# Patient Record
Sex: Male | Born: 1940 | Race: White | Hispanic: No | Marital: Married | Smoking: Never smoker
Health system: Southern US, Community
[De-identification: ages and names within clinical notes are randomized; demographics above are authoritative.]

## PROBLEM LIST (undated history)

## (undated) DIAGNOSIS — I4510 Unspecified right bundle-branch block: Secondary | ICD-10-CM

## (undated) DIAGNOSIS — E781 Pure hyperglyceridemia: Secondary | ICD-10-CM

## (undated) HISTORY — DX: Pure hyperglyceridemia: E78.1

## (undated) HISTORY — DX: Hemochromatosis, unspecified: E83.119

## (undated) HISTORY — DX: Unspecified right bundle-branch block: I45.10

## (undated) HISTORY — PX: BACK SURGERY: SHX140

---

## 2003-07-21 ENCOUNTER — Encounter: Admission: RE | Admit: 2003-07-21 | Discharge: 2003-08-06 | Payer: Self-pay | Admitting: Occupational Medicine

## 2004-05-31 ENCOUNTER — Encounter: Admission: RE | Admit: 2004-05-31 | Discharge: 2004-05-31 | Payer: Self-pay | Admitting: Specialist

## 2004-07-28 ENCOUNTER — Observation Stay (HOSPITAL_COMMUNITY): Admission: RE | Admit: 2004-07-28 | Discharge: 2004-07-29 | Payer: Self-pay | Admitting: Specialist

## 2007-08-12 ENCOUNTER — Encounter: Admission: RE | Admit: 2007-08-12 | Discharge: 2007-08-12 | Payer: Self-pay | Admitting: Family Medicine

## 2010-07-29 NOTE — Op Note (Signed)
NAME:  Mark Booker, Mark Booker NO.:  [id number]   MEDICAL RECORD NO.:  [id number]          PATIENT TYPE:  AMB   LOCATION:  DAY                          FACILITY:  [facility]   PHYSICIAN:  Jene Every, M.D.    DATE OF BIRTH:  March 27, 1940   DATE OF PROCEDURE:  07/28/2004  DATE OF DISCHARGE:                                 OPERATIVE REPORT   PREOPERATIVE DIAGNOSIS:  Spinal stenosis, L4-L5, left.   POSTOPERATIVE DIAGNOSIS:  Spinal stenosis, L4-L5, left.   PROCEDURE PERFORMED:  Lateral recess decompression with microdiskectomy, L4-  L5, foraminotomy L4 and L5.   ANESTHESIA:  General.   SURGEON:  Jene Every, M.D.   ASSISTANT:  Roma Schanz, P.A.   BRIEF HISTORY AND INDICATIONS:  This is a 70 year old with refractory left  lower extremity radicular pain in the L5 nerve root distribution, positive  neural tension signs, EHL weakness due to lateral recess stenosis and neural  compressive injury.  Operative intervention was indicated for decompression  of the L5 nerve root. Risks and benefits were discussed including bleeding,  infection, _damage to neurovascular structures_________, CSF leakage,  epidural fibrosis, adjacent segment disease, need for fusion in the future,  etc.   TECHNIQUE:  The patient was placed in the supine position and after the  induction of adequate general anesthesia, 1 g of Kefzol was given, and he  was placed prone on the Edwards frame.  All bony prominences were well  padded.  The lumbar region was prepped and draped in the usual sterile  fashion.  Two 18-gauge spinal needles were utilized and localize the L4-L5  space, confirmed with x-ray.  Incision was made from the spinous processes  of L4 to L5.  Subcutaneous tissue was dissected. Electrocautery was utilized  to achieve hemostasis.  Dorsolumbar fascia was identified and divided in  line with the skin incision.  Paraspinous muscles were elevated from the  laminae of L4 and L5.  The  McCullough retractor was placed.  The operating  microscope was draped and brought into the surgical field after a Penfield 4  was placed in the interlaminar space, and an x-ray was obtained to confirmed  the L4-L5 space.  A hemilaminectomy at the caudal edge of 4 performed with  the Leksell, a 2 and a 3-mm Kerrison detaching the cephalad edge of the  ligamentum.  The ligamentum was detached from the cephalad edge of L4 with a  straight curette.  Laminotomy of L5 was performed with 2 and a 3-mm  Kerrison. Foraminotomy of L5 was performed.  Significant hypertrophic  ligamentum flavum and facet arthropathy was noted compressing the L5 root  and the lateral recess.  The L5 root was gently mobilized medially, and the  lateral recess decompressed with 2 and 3-mm Kerrison to the medial border of  the pedicle.  Bipolar cautery was utilized to achieve hemostasis.  Exam of  the disk was not herniated.  Hockey stick probe was placed in the foramen at  L4 and L5 and found it widely patent.  There was no disk material underneath  the thecal sac, or in the  axilla of the root, or in either foramen.  Next,  the wound was copiously irrigated with antibiotic irrigation.  Bone wax was  placed on the cancellous surfaces.  There was no evidence of CSF leak or  direct bleeding.  Thrombin-soaked Gelfoam was placed in the laminotomy  defect.  The McCullough retractor was removed.  Paraspinous muscle was  inspected with no evidence of active bleeding.  Dorsolumbar fascia was re-  approximated with #1 Vicryl interrupted figure-of-eight sutures.  The  subcutaneous tissue was re-approximated with 2-0 Vicryl simple sutures, and  the skin was re-approximated with 4-0 subcuticular Prolene.  The wound was  reinforced with Steri-Strips.  Sterile dressing was applied.  The patient  was placed supine on the hospital bed, extubated without difficulty, and  transported to recovery in satisfactory condition.   The patient  tolerated the procedure well with no complications.   Blood loss was 30 cc.      JB/MEDQ  D:  07/28/2004  T:  07/28/2004  Job:  [id number]

## 2011-09-29 ENCOUNTER — Ambulatory Visit
Admission: RE | Admit: 2011-09-29 | Discharge: 2011-09-29 | Disposition: A | Discharge status: Home / Self Care | Payer: Medicare Other | Source: Ambulatory Visit | Attending: Family Medicine | Admitting: Family Medicine

## 2011-09-29 ENCOUNTER — Other Ambulatory Visit: Payer: Self-pay | Admitting: Family Medicine

## 2011-09-29 DIAGNOSIS — M545 Low back pain: Secondary | ICD-10-CM

## 2011-10-19 ENCOUNTER — Telehealth: Payer: Self-pay | Admitting: Oncology

## 2011-10-19 NOTE — Telephone Encounter (Signed)
LMONVM FOR PT RE CALLING ME FOR APPT W/FS.

## 2011-10-19 NOTE — Telephone Encounter (Signed)
Pt returned call and was given appt for 8/12 w/FS.

## 2011-10-20 ENCOUNTER — Other Ambulatory Visit: Payer: Self-pay | Admitting: Oncology

## 2011-10-23 ENCOUNTER — Other Ambulatory Visit: Payer: Self-pay | Admitting: Oncology

## 2011-10-23 ENCOUNTER — Telehealth: Payer: Self-pay | Admitting: Oncology

## 2011-10-23 NOTE — Telephone Encounter (Signed)
Referred by Dr. Blair Heys.

## 2011-10-24 ENCOUNTER — Ambulatory Visit (HOSPITAL_BASED_OUTPATIENT_CLINIC_OR_DEPARTMENT_OTHER): Payer: Medicare Other | Admitting: Oncology

## 2011-10-24 ENCOUNTER — Other Ambulatory Visit (HOSPITAL_BASED_OUTPATIENT_CLINIC_OR_DEPARTMENT_OTHER): Payer: Medicare Other | Admitting: Lab

## 2011-10-24 ENCOUNTER — Encounter: Payer: Self-pay | Admitting: Oncology

## 2011-10-24 ENCOUNTER — Ambulatory Visit: Payer: Medicare Other

## 2011-10-24 LAB — FERRITIN: Ferritin: 52 ng/mL (ref 22–322)

## 2011-10-24 LAB — CBC WITH DIFFERENTIAL/PLATELET
BASO%: 0.6 % (ref 0.0–2.0)
Basophils Absolute: 0 10*3/uL (ref 0.0–0.1)
EOS%: 2.2 % (ref 0.0–7.0)
HCT: 44.2 % (ref 38.4–49.9)
HGB: 15.5 g/dL (ref 13.0–17.1)
LYMPH%: 33.1 % (ref 14.0–49.0)
MCH: 34.4 pg — ABNORMAL HIGH (ref 27.2–33.4)
MCHC: 35.1 g/dL (ref 32.0–36.0)
MCV: 97.9 fL (ref 79.3–98.0)
MONO%: 9.5 % (ref 0.0–14.0)
NEUT%: 54.6 % (ref 39.0–75.0)
Platelets: 195 10*3/uL (ref 140–400)

## 2011-10-24 LAB — IRON AND TIBC
%SAT: 60 % — ABNORMAL HIGH (ref 20–55)
TIBC: 239 ug/dL (ref 215–435)

## 2011-10-24 LAB — COMPREHENSIVE METABOLIC PANEL
ALT: 14 U/L (ref 0–53)
AST: 20 U/L (ref 0–37)
Alkaline Phosphatase: 72 U/L (ref 39–117)
BUN: 15 mg/dL (ref 6–23)
Calcium: 9.3 mg/dL (ref 8.4–10.5)
Chloride: 103 mEq/L (ref 96–112)
Creatinine, Ser: 1.01 mg/dL (ref 0.50–1.35)
Total Bilirubin: 0.6 mg/dL (ref 0.3–1.2)

## 2011-10-24 NOTE — Progress Notes (Signed)
Patient came in today as a new patient with his wife and he has one insurance,he said that he should be [place] as far as financial assistance.

## 2011-10-24 NOTE — Progress Notes (Signed)
Note dictated

## 2011-10-24 NOTE — Progress Notes (Signed)
CC:   Mark Booker. Mark Booker, M.D.  REASON FOR CONSULTATION:  History of hemochromatosis, elevated MCV.  HISTORY OF PRESENT ILLNESS:  Mark Booker is a pleasant 71 year old gentleman currently of Blue.  He is a relatively healthy 71 year old man without really any significant comorbid conditions.  He has a history of GERD as well as glaucoma but for the most part has been in general excellent health.  About 15 years ago he was told that he has hemochromatosis based on a blood testing.  He reports that he had a liver biopsy as well and was instructed to donate blood occasionally. He has been donating blood to the [facility] about once every 6 months. Last time he had done it was about 3 months ago, and he has been followed routinely by Dr. Manus Booker for his routine health maintenance. In his most recent blood work back on October 13, 2011, his hemoglobin was 17 g, the upper limit of normal was 17.  His white count was 7.4.  His MCV was 100.1, upper limit of normal was 94.  His platelet count was 213.  He had normal chemistries.  He has normal liver function tests. His iron level at that time was 54, which was in normal range.  It was 40 as depicted back in June of 2012.  For that reason, the patient was referred to me for evaluation regarding his potential hemochromatosis. Clinically, Mark Booker is asymptomatic.  He does not report any abdominal pain.  Does not report any shoulder pain.  He had not had any skin changes.  He had not had any problem with diabetes or blood sugar intolerance.  Performance status and activity level are really unchanged.  REVIEW OF SYSTEMS:  He does not report any headaches, blurry vision, double vision.  He did not report any motor or sensory neuropathy.  He does not report any alteration in mental status.  He does not report any psychiatric issues, depression.  Did not report any fever, chills, sweats.  Does not report any cough, hemoptysis, hematemesis.  No  nausea or vomiting.  Did not report any abdominal pain.  No hematochezia.  No melena.  No genitourinary complaints.  Rest of review of systems is unremarkable.  PAST MEDICAL HISTORY:  Significant for GERD, history of glaucoma, psoriasis, back pain, status post a hemorrhoid surgery.  MEDICATION:  He is on Prilosec, glucosamine, vitamin D supplements, and Xalatan ophthalmic solution.  FAMILY HISTORY:  His father had prostate cancer in his 30s.  Mother had rheumatoid arthritis.  He has 1 brother with prostate cancer.  I think he has a cousin who has hemochromatosis.  SOCIAL HISTORY:  He is married.  He does not have any children.  He denied any alcohol or tobacco abuse.  ALLERGIES:  None.  PHYSICAL EXAMINATION:  Alert, awake gentleman, who appeared in no active distress today.  His blood pressure is 114/76, pulse 82, respiration 20, temperature is 97.  HEENT:  Head is normocephalic, atraumatic.  Pupils equal and round, reactive to light.  Oral mucosa moist and pink.  Neck: Supple without adenopathy.  Heart:  Regular rate and rhythm.  S1 and S2. Lungs:  Clear to auscultation.  Abdomen:  Soft, nontender.  No splenomegaly.  The edge of the liver is barely palpable on examination. No rebound or guarding.  Extremities:  No edema.  LABORATORY DATA:  His hemoglobin is 15.5, white cell count is 6.6, platelet count of 195, all within normal range.  His MCV is  actually normal at 79.9.  ASSESSMENT AND PLAN:  A 71 year old gentleman the following issues: 1. 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  As it stands right now, I     really see no evidence of increase in his iron metabolism at this     point.  His ferritin level is within normal range.  His hemoglobin     is within perfectly normal range.  His liver function test is all     within normal range.  There is no physical exam finding 2 suggest     iron deposition disease.  At this time, I really do not see any     intervention needed from  a hematology standpoint.  He inquired     about getting a phlebotomy about once every 6 months.  I told him I     think that would be fine.  Donating blood about once every 6-12     months will be reasonable. at this time. 2. MCV elevation.  Again, his MCV is perfectly normal at this time.     Again, this could be related to a very mild form of hemochromatosis     that right now is no longer an issue.  RECOMMENDATION:  For moving forward, I do recommend that he gets a CBC and iron checked about once a year, and I did not schedule Mark Booker any followup here but I will be more than happy to see him anytime felt needed.    ______________________________ Benjiman Core, M.D. FNS/MEDQ  D:  10/24/2011  T:  10/24/2011  Job:  [id number]

## 2013-09-04 IMAGING — CR DG LUMBAR SPINE COMPLETE 4+V
5 series · 5 of 5 positions shown · non-contrast
Comparison: Lumbar spine films of 01/27/2011

CLINICAL DATA: Left-sided low back pain, no injury

LUMBAR SPINE - COMPLETE 4+ VIEW

[t l-spine a.p.]
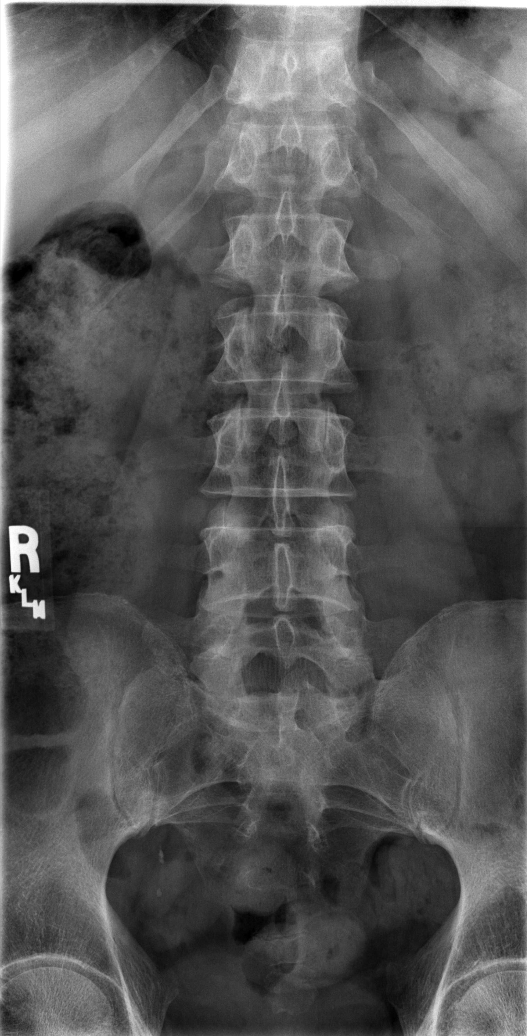

[t l-spine oblique exposure (1 of 2)]
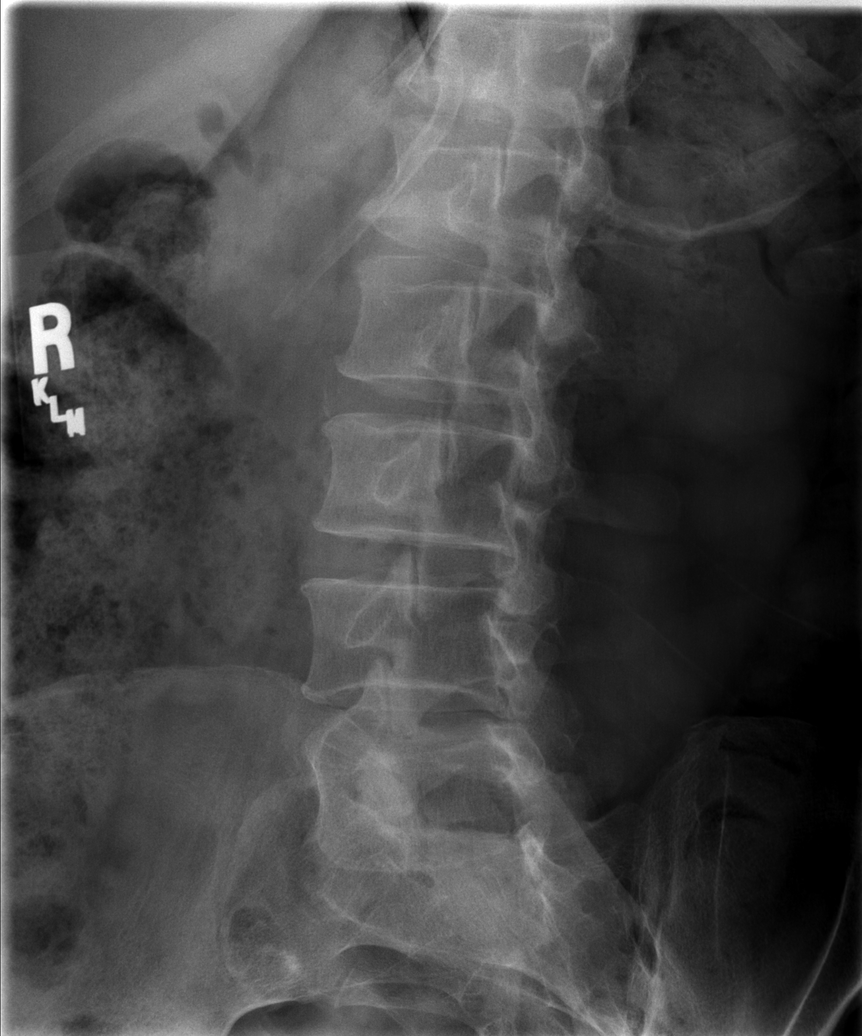

[t l-spine oblique exposure (2 of 2)]
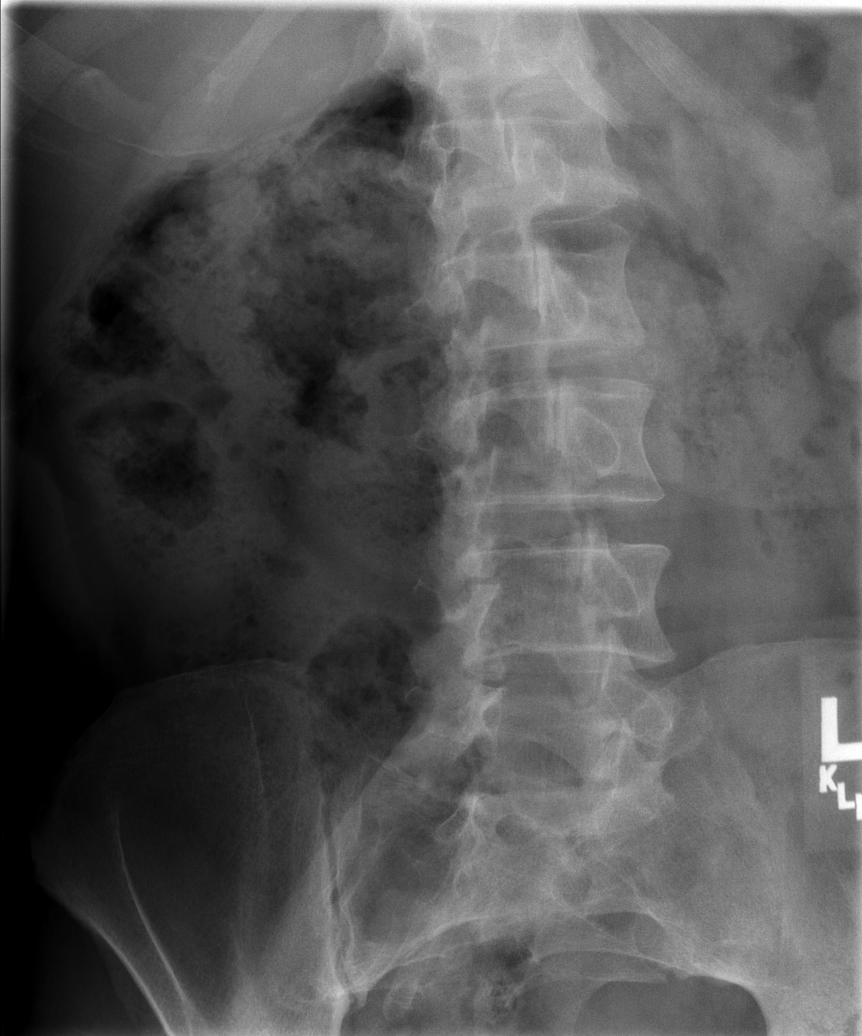

[t l-spine lat]
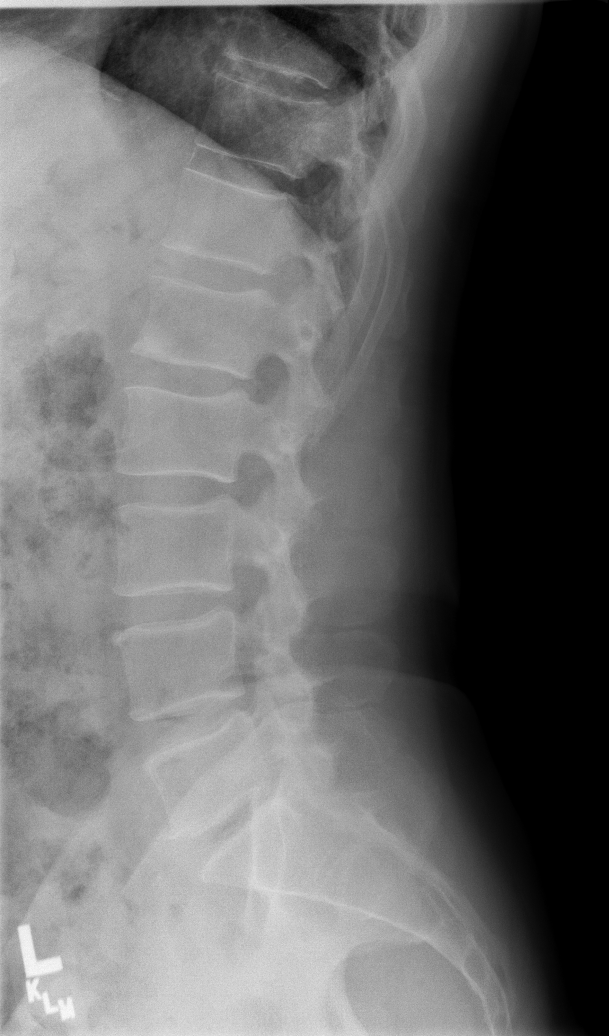

[t l-spine l5-s1 spot]
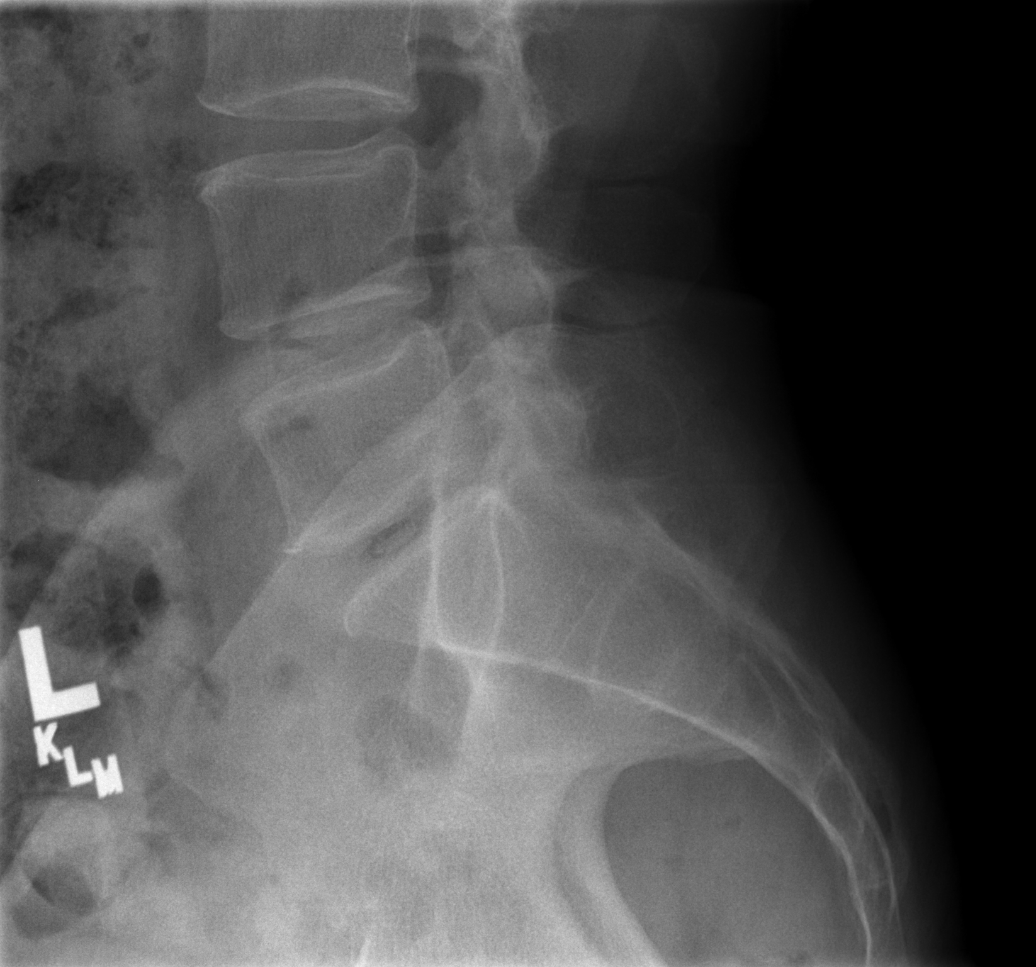

[5 of 5 positions shown; findings below may reference images not displayed]

FINDINGS: The lumbar vertebrae remain in normal alignment.  There
is degenerative disc disease which appears relatively stable at the
L5 S1 level where there is some loss of disc space and mild vacuum
disc phenomenon.  There may be mild vacuum disc phenomenon at L4-5
but the intervertebral disc space is maintained.  The remainder of
the intervertebral disc spaces appear normal.  No compression
deformity is seen.  The SI joints appear corticated.
IMPRESSION: Relatively stable degenerative disc disease at L5 S1.  Normal
alignment.

## 2016-01-25 ENCOUNTER — Telehealth: Payer: Self-pay | Admitting: Oncology

## 2016-01-25 NOTE — Telephone Encounter (Signed)
lvm to inform pt of 12/7 appt date/time per LOS °

## 2016-02-16 ENCOUNTER — Other Ambulatory Visit: Payer: Self-pay | Admitting: Oncology

## 2016-02-17 ENCOUNTER — Ambulatory Visit (HOSPITAL_BASED_OUTPATIENT_CLINIC_OR_DEPARTMENT_OTHER): Payer: Medicare Other | Admitting: Oncology

## 2016-02-17 ENCOUNTER — Other Ambulatory Visit (HOSPITAL_BASED_OUTPATIENT_CLINIC_OR_DEPARTMENT_OTHER): Payer: Medicare Other

## 2016-02-17 ENCOUNTER — Telehealth: Payer: Self-pay | Admitting: Oncology

## 2016-02-17 LAB — COMPREHENSIVE METABOLIC PANEL
ALT: 31 U/L (ref 0–55)
ANION GAP: 10 meq/L (ref 3–11)
AST: 30 U/L (ref 5–34)
Albumin: 3.8 g/dL (ref 3.5–5.0)
Alkaline Phosphatase: 97 U/L (ref 40–150)
BUN: 16 mg/dL (ref 7.0–26.0)
CHLORIDE: 108 meq/L (ref 98–109)
CO2: 25 meq/L (ref 22–29)
Calcium: 9.7 mg/dL (ref 8.4–10.4)
Creatinine: 1.1 mg/dL (ref 0.7–1.3)
EGFR: 65 mL/min/{1.73_m2} — AB (ref >90)
Glucose: 107 mg/dl (ref 70–140)
Potassium: 5.2 mEq/L — ABNORMAL HIGH (ref 3.5–5.1)
Sodium: 142 mEq/L (ref 136–145)
Total Bilirubin: 0.56 mg/dL (ref 0.20–1.20)
Total Protein: 7.5 g/dL (ref 6.4–8.3)

## 2016-02-17 LAB — CBC WITH DIFFERENTIAL/PLATELET
BASO%: 0.3 % (ref 0.0–2.0)
Basophils Absolute: 0 10*3/uL (ref 0.0–0.1)
EOS ABS: 0.1 10*3/uL (ref 0.0–0.5)
EOS%: 1.9 % (ref 0.0–7.0)
HCT: 45.5 % (ref 38.4–49.9)
HGB: 15.8 g/dL (ref 13.0–17.1)
LYMPH%: 36.4 % (ref 14.0–49.0)
MCH: 34.1 pg — ABNORMAL HIGH (ref 27.2–33.4)
MCHC: 34.7 g/dL (ref 32.0–36.0)
MCV: 98.3 fL — AB (ref 79.3–98.0)
MONO#: 0.6 10*3/uL (ref 0.1–0.9)
MONO%: 9.5 % (ref 0.0–14.0)
NEUT#: 3.3 10*3/uL (ref 1.5–6.5)
NEUT%: 51.9 % (ref 39.0–75.0)
PLATELETS: 217 10*3/uL (ref 140–400)
RBC: 4.63 10*6/uL (ref 4.20–5.82)
RDW: 12.8 % (ref 11.0–14.6)
WBC: 6.3 10*3/uL (ref 4.0–10.3)
lymph#: 2.3 10*3/uL (ref 0.9–3.3)

## 2016-02-17 NOTE — Progress Notes (Signed)
Hematology and Oncology Follow Up Visit  Mark Booker [id number] 05/09/1940 75 y.o. 02/17/2016 3:48 PM No primary care provider on file.No ref. provider found   Principle Diagnosis: 75 year old gentleman with history of hemachromatosis with intermittent phlebotomies that has been done in the past.  Current therapy: Observation and surveillance.  Interim History: Mr. Mark Booker this today for a follow-up visit. He is a pleasant gentleman I saw in consultation the past for presumed hemachromatosis. At that time his ferritin level was within normal range. His last evaluation was in 2013. He has been followed by his primary care provider regarding this issue. He recently had a ferritin level checked and was elevated at 300 and I was asked to comment about these findings. Clinically he is completely asymptomatic at this time. He does not report any major changes in his performance status, or activity level. He declines any joint pain, discoloration of the skin or change in his mobility. He continues to attend his activities of daily living.  He does not report any headaches, blurry vision, syncope or seizures. He does not report any fevers or chills or sweats. He does not report any cough, wheezing or hemoptysis. He does not report any nausea, vomiting or abdominal pain. He does not report any frequency urgency or hesitancy. He does not report any skeletal complaints. Remaining review of systems unremarkable.  Medications: I have reviewed the patient's current medications.  Current Outpatient Prescriptions  Medication Sig Dispense Refill  . fish oil-omega-3 fatty acids 1000 MG capsule Take 1 g by mouth daily.    Marland Kitchen. glucosamine-chondroitin 500-400 MG tablet Take 1 tablet by mouth daily.    Marland Kitchen. omeprazole (PRILOSEC) 20 MG capsule Take 20 mg by mouth daily.     No current facility-administered medications for this visit.      Allergies: Not on File  Past Medical History, Surgical history, Social  history, and Family History were reviewed and updated.   Physical Exam: Blood pressure 127/68, pulse 61, temperature 97.6 F (36.4 C), temperature source Oral, resp. rate 16, height 6\' 1"  (1.854 m), weight 194 lb 3.2 oz (88.1 kg), SpO2 98 %. ECOG: 0 General appearance: alert and cooperative Head: Normocephalic, without obvious abnormality Neck: no adenopathy Lymph nodes: Cervical, supraclavicular, and axillary nodes normal. Heart:regular rate and rhythm, S1, S2 normal, no murmur, click, rub or gallop Lung:chest clear, no wheezing, rales, normal symmetric air entry Abdomin: soft, non-tender, without masses or organomegaly EXT:no erythema, induration, or nodules   Lab Results: Lab Results  Component Value Date   WBC 6.3 02/17/2016   HGB 15.8 02/17/2016   HCT 45.5 02/17/2016   MCV 98.3 (H) 02/17/2016   PLT 217 02/17/2016     Chemistry      Component Value Date/Time   NA 142 02/17/2016 1503   K 5.2 (H) 02/17/2016 1503   CL 103 10/24/2011 1035   CO2 25 02/17/2016 1503   BUN 16.0 02/17/2016 1503   CREATININE 1.1 02/17/2016 1503      Component Value Date/Time   CALCIUM 9.7 02/17/2016 1503   ALKPHOS 97 02/17/2016 1503   AST 30 02/17/2016 1503   ALT 31 02/17/2016 1503   BILITOT 0.56 02/17/2016 1503          Impression and Plan:  75 year old gentleman with the following issues:  1. Hemochromatosis: This has been a presumed diagnosis 4/20 years. The natural course of this disease was reviewed today he appears to have a mild phenotype with very little to no manifestation of  this disease. I recommend different strategies to manage this moving forward including therapeutic phlebotomy and possibly blood donation. The plan is to recheck his ferritin level and institute therapeutic phlebotomy if his ferritin level continues to be elevated above 500.  2. Liver function surveillance: His liver function test has been within normal range and he is completely asymptomatic.  3.  Follow-up: Will be in 6 months and repeat his iron levels.   [name],[name], MD 12/7/20173:48 PM

## 2016-02-17 NOTE — Telephone Encounter (Signed)
Appointments scheduled per 12/7 LOS. Patient given AVS report and calendars with future scheduled appointments.  °

## 2016-02-18 LAB — IRON AND TIBC
%SAT: 80 % — AB (ref 20–55)
Iron: 186 ug/dL — ABNORMAL HIGH (ref 42–163)
TIBC: 234 ug/dL (ref 202–409)
UIBC: 48 ug/dL — ABNORMAL LOW (ref 117–376)

## 2016-02-18 LAB — FERRITIN: FERRITIN: 234 ng/mL (ref 22–316)

## 2016-08-17 ENCOUNTER — Ambulatory Visit: Payer: Medicare Other | Admitting: Oncology

## 2016-08-17 ENCOUNTER — Other Ambulatory Visit (HOSPITAL_BASED_OUTPATIENT_CLINIC_OR_DEPARTMENT_OTHER): Payer: Medicare Other

## 2016-08-17 LAB — CBC WITH DIFFERENTIAL/PLATELET
BASO%: 0.7 % (ref 0.0–2.0)
BASOS ABS: 0 10*3/uL (ref 0.0–0.1)
EOS%: 1.7 % (ref 0.0–7.0)
Eosinophils Absolute: 0.1 10*3/uL (ref 0.0–0.5)
HEMATOCRIT: 44.7 % (ref 38.4–49.9)
HGB: 15.7 g/dL (ref 13.0–17.1)
LYMPH#: 2.2 10*3/uL (ref 0.9–3.3)
LYMPH%: 35.3 % (ref 14.0–49.0)
MCH: 34.8 pg — AB (ref 27.2–33.4)
MCHC: 35.2 g/dL (ref 32.0–36.0)
MCV: 99 fL — AB (ref 79.3–98.0)
MONO#: 0.5 10*3/uL (ref 0.1–0.9)
MONO%: 8.8 % (ref 0.0–14.0)
NEUT#: 3.3 10*3/uL (ref 1.5–6.5)
NEUT%: 53.5 % (ref 39.0–75.0)
Platelets: 208 10*3/uL (ref 140–400)
RBC: 4.51 10*6/uL (ref 4.20–5.82)
RDW: 12.8 % (ref 11.0–14.6)
WBC: 6.2 10*3/uL (ref 4.0–10.3)

## 2016-08-17 LAB — FERRITIN: FERRITIN: 64 ng/mL (ref 22–316)

## 2016-08-17 LAB — COMPREHENSIVE METABOLIC PANEL
ALT: 33 U/L (ref 0–55)
ANION GAP: 8 meq/L (ref 3–11)
AST: 36 U/L — AB (ref 5–34)
Albumin: 4 g/dL (ref 3.5–5.0)
Alkaline Phosphatase: 87 U/L (ref 40–150)
BUN: 10.9 mg/dL (ref 7.0–26.0)
CALCIUM: 9.6 mg/dL (ref 8.4–10.4)
CHLORIDE: 106 meq/L (ref 98–109)
CO2: 24 mEq/L (ref 22–29)
Creatinine: 1.2 mg/dL (ref 0.7–1.3)
EGFR: 61 mL/min/{1.73_m2} — AB (ref >90)
Glucose: 102 mg/dl (ref 70–140)
POTASSIUM: 4.4 meq/L (ref 3.5–5.1)
Sodium: 139 mEq/L (ref 136–145)
Total Bilirubin: 0.83 mg/dL (ref 0.20–1.20)
Total Protein: 7.2 g/dL (ref 6.4–8.3)

## 2016-08-17 LAB — IRON AND TIBC
%SAT: 46 % (ref 20–55)
IRON: 131 ug/dL (ref 42–163)
TIBC: 284 ug/dL (ref 202–409)
UIBC: 153 ug/dL (ref 117–376)

## 2016-08-24 ENCOUNTER — Telehealth: Payer: Self-pay | Admitting: Oncology

## 2016-08-24 ENCOUNTER — Ambulatory Visit (HOSPITAL_BASED_OUTPATIENT_CLINIC_OR_DEPARTMENT_OTHER): Payer: Medicare Other | Admitting: Oncology

## 2016-08-24 NOTE — Telephone Encounter (Signed)
Scheduled appt per 6/14 los -Gave patient AVS and calender per LOS- lab and f/u in 6 month.

## 2016-08-24 NOTE — Progress Notes (Signed)
Hematology and Oncology Follow Up Visit  Mark Booker [id number] 05/02/1940 76 y.o. 08/24/2016 9:24 AM Mark Booker, Mark Booker, MDNo ref. provider found   Principle Diagnosis: 76 year old gentleman with history of hemachromatosis with intermittent phlebotomies that has been done in the past. This was diagnosed in 2013. This was diagnosed by liver biopsy that is not available to me at this time.  Current therapy: Intermittent phlebotomies he [facility].  Interim History: Mark Booker this today for a follow-up visit. Since the last visit, he has been donating blood intermittently close to 2 months. He tolerated blood donation without complications. He denied any nausea or dizziness. He denied any recent falls or decline in his performance status. He denied any skin discoloration or any hospitalizations. He continues to perform activities of daily living without decline.  He does not report any headaches, blurry vision, syncope or seizures. He does not report any fevers or chills or sweats. He does not report any cough, wheezing or hemoptysis. He does not report any nausea, vomiting or abdominal pain. He does not report any frequency urgency or hesitancy. He does not report any skeletal complaints. Remaining review of systems unremarkable.  Medications: I have reviewed the patient's current medications.  Current Outpatient Prescriptions  Medication Sig Dispense Refill  . fish oil-omega-3 fatty acids 1000 MG capsule Take 1 g by mouth daily.    Marland Kitchen. glucosamine-chondroitin 500-400 MG tablet Take 1 tablet by mouth daily.    Marland Kitchen. omeprazole (PRILOSEC) 20 MG capsule Take 20 mg by mouth daily.     No current facility-administered medications for this visit.      Allergies: Not on File  Past Medical History, Surgical history, Social history, and Family History were reviewed and updated.   Physical Exam: Blood pressure 122/70, pulse (!) 57, temperature 98 F  (36.7 Booker), temperature source Oral, resp. rate 17, height 6\' 1"  (1.854 m), weight 195 lb 6.4 oz (88.6 kg), SpO2 100 %. ECOG: 0 General appearance: alert and cooperative. Without distress. Head: Normocephalic, without obvious abnormality. No oral ulcers or lesions. Neck: no adenopathy Lymph nodes: Cervical, supraclavicular, and axillary nodes normal. Heart:regular rate and rhythm, S1, S2 normal, no murmur, click, rub or gallop Lung:chest clear, no wheezing, rales, normal symmetric air entry Abdomin: soft, non-tender, without masses or organomegaly no shifting dullness or ascites. EXT:no erythema, induration, or nodules   Lab Results: Lab Results  Component Value Date   WBC 6.2 08/17/2016   HGB 15.7 08/17/2016   HCT 44.7 08/17/2016   MCV 99.0 (H) 08/17/2016   PLT 208 08/17/2016     Chemistry      Component Value Date/Time   NA 139 08/17/2016 0851   K 4.4 08/17/2016 0851   CL 103 10/24/2011 1035   CO2 24 08/17/2016 0851   BUN 10.9 08/17/2016 0851   CREATININE 1.2 08/17/2016 0851      Component Value Date/Time   CALCIUM 9.6 08/17/2016 0851   ALKPHOS 87 08/17/2016 0851   AST 36 (H) 08/17/2016 0851   ALT 33 08/17/2016 0851   BILITOT 0.83 08/17/2016 0851     Results for Mark Booker, Mark Booker (Mark Booker) as of 08/24/2016 09:07  Ref. Range 08/17/2016 08:51  Iron Latest Ref Range: 42 - 163 ug/dL 130131  UIBC Latest Ref Range: 117 - 376 ug/dL 865153  TIBC Latest Ref Range: 202 - 409 ug/dL 784284  %SAT Latest Ref Range: 20 - 55 % 46  Ferritin Latest Ref Range:  22 - 316 ng/ml 64    Impression and Plan:  76 year old gentleman with the following issues:  1. Hemochromatosis: This has been a presumed diagnosis since the late 90s. He has been donating lung intermittently to prevent accumulation of iron and to prevent organ damage.  Iron studies obtained on 08/17/2016 were personally reviewed and showed excellent response to this approach.  The plan is for him to continue intermittent  phlebotomies at a rate of one to 2 tabs every 6 months to keep his ferritin close to 50. He is completely asymptomatic at this time and I encouraged him to continue with this approach.  2. Liver function surveillance: His liver function test has been within normal range and he is completely asymptomatic.  3. Follow-up: Will be in 6 months and repeat his iron levels.   [facility], MD 6/14/20189:24 AM

## 2017-02-08 ENCOUNTER — Other Ambulatory Visit: Payer: Medicare Other

## 2017-02-08 ENCOUNTER — Telehealth: Payer: Self-pay | Admitting: *Deleted

## 2017-02-08 NOTE — Telephone Encounter (Signed)
"  Calling to confirm appointment information and ask do I need to fast?"  Provided all future appointment information and no fasting needed for labs.  Denies further needs or questions with this call.

## 2017-02-09 ENCOUNTER — Other Ambulatory Visit (HOSPITAL_BASED_OUTPATIENT_CLINIC_OR_DEPARTMENT_OTHER): Payer: Medicare Other

## 2017-02-09 LAB — CBC WITH DIFFERENTIAL/PLATELET
BASO%: 0.5 % (ref 0.0–2.0)
Basophils Absolute: 0 10*3/uL (ref 0.0–0.1)
EOS ABS: 0.1 10*3/uL (ref 0.0–0.5)
EOS%: 1.6 % (ref 0.0–7.0)
HCT: 45.2 % (ref 38.4–49.9)
HEMOGLOBIN: 15.8 g/dL (ref 13.0–17.1)
LYMPH%: 29.1 % (ref 14.0–49.0)
MCH: 34.1 pg — ABNORMAL HIGH (ref 27.2–33.4)
MCHC: 35 g/dL (ref 32.0–36.0)
MCV: 97.6 fL (ref 79.3–98.0)
MONO#: 0.6 10*3/uL (ref 0.1–0.9)
MONO%: 9 % (ref 0.0–14.0)
NEUT%: 59.8 % (ref 39.0–75.0)
NEUTROS ABS: 3.8 10*3/uL (ref 1.5–6.5)
PLATELETS: 216 10*3/uL (ref 140–400)
RBC: 4.63 10*6/uL (ref 4.20–5.82)
RDW: 12.7 % (ref 11.0–14.6)
WBC: 6.4 10*3/uL (ref 4.0–10.3)
lymph#: 1.9 10*3/uL (ref 0.9–3.3)

## 2017-02-09 LAB — IRON AND TIBC
%SAT: 99 % — ABNORMAL HIGH (ref 20–55)
Iron: 249 ug/dL — ABNORMAL HIGH (ref 42–163)
TIBC: 251 ug/dL (ref 202–409)
UIBC: 2 ug/dL — ABNORMAL LOW (ref 117–376)

## 2017-02-09 LAB — FERRITIN: FERRITIN: 92 ng/mL (ref 22–316)

## 2017-02-14 ENCOUNTER — Other Ambulatory Visit: Payer: Medicare Other

## 2017-02-21 ENCOUNTER — Ambulatory Visit: Payer: Medicare Other | Admitting: Oncology

## 2017-03-09 ENCOUNTER — Telehealth: Payer: Self-pay | Admitting: Oncology

## 2017-03-09 ENCOUNTER — Ambulatory Visit (HOSPITAL_BASED_OUTPATIENT_CLINIC_OR_DEPARTMENT_OTHER): Payer: Medicare Other | Admitting: Oncology

## 2017-03-09 NOTE — Progress Notes (Signed)
Hematology and Oncology Follow Up Visit  Mark FlurryRichard C Booker [id number] 04/28/1940 76 y.o. 03/09/2017 1:41 PM Manus GunningEhinger, Molly Maduroobert, MDEhinger, Molly Maduroobert, MD   Principle Diagnosis: 76 year old gentleman with history of hemachromatosis with intermittent phlebotomies that has been done in the past. This was diagnosed in 2013. This was diagnosed by liver biopsy that is not available to me at this time.  Current therapy: Intermittent phlebotomies he [facility].  Interim History: Mr. Mark HornCoffer this today for a follow-up visit. Since the last visit, he reports no major changes in his health.  He has not donated any blood since last visit 6 months ago.  He denied any issues when he did donate blood in the past.  He denied any nausea or dizziness. He denied any recent falls or decline in his performance status. He denied any skin discoloration or any hospitalizations.  His performance status and quality of life remain excellent.  He does not report any headaches, blurry vision, syncope or seizures. He does not report any fevers or chills or sweats. He does not report any cough, wheezing or hemoptysis. He does not report any nausea, vomiting or abdominal pain. He does not report any frequency urgency or hesitancy. He does not report any skeletal complaints. Remaining review of systems unremarkable.  Medications: I have reviewed the patient's current medications.  Current Outpatient Medications  Medication Sig Dispense Refill  . fish oil-omega-3 fatty acids 1000 MG capsule Take 1 g by mouth daily.    Marland Kitchen. glucosamine-chondroitin 500-400 MG tablet Take 1 tablet by mouth daily.    Marland Kitchen. omeprazole (PRILOSEC) 20 MG capsule Take 20 mg by mouth daily.     No current facility-administered medications for this visit.      Allergies: Not on File  Past Medical History, Surgical history, Social history, and Family History were reviewed and updated.   Physical Exam: Blood pressure  130/76, pulse 78, temperature 97.8 F (36.6 C), temperature source Oral, resp. rate 18, height 6\' 1"  (1.854 m), weight 197 lb 9.6 oz (89.6 kg), SpO2 100 %. ECOG: 0 General appearance: Well-appearing gentleman without distress. Head: Normocephalic, without obvious abnormality. No oral ulcers or thrush.  Neck: no adenopathy or masses. Lymph nodes: Cervical, supraclavicular, and axillary nodes normal. Heart:regular rate and rhythm, S1, S2 normal, no murmur, click, rub or gallop Lung:chest clear, no wheezing, rales, normal symmetric air entry Abdomin: soft, non-tender, without masses or organomegaly no rebound or guarding. EXT:no erythema, induration, or nodules   Lab Results: Lab Results  Component Value Date   WBC 6.4 02/09/2017   HGB 15.8 02/09/2017   HCT 45.2 02/09/2017   MCV 97.6 02/09/2017   PLT 216 02/09/2017     Chemistry      Component Value Date/Time   NA 139 08/17/2016 0851   K 4.4 08/17/2016 0851   CL 103 10/24/2011 1035   CO2 24 08/17/2016 0851   BUN 10.9 08/17/2016 0851   CREATININE 1.2 08/17/2016 0851      Component Value Date/Time   CALCIUM 9.6 08/17/2016 0851   ALKPHOS 87 08/17/2016 0851   AST 36 (H) 08/17/2016 0851   ALT 33 08/17/2016 0851   BILITOT 0.83 08/17/2016 0851     Results for Brozek, Mark Booker C (MRN [id number]) as of 03/09/2017 13:12  Ref. Range 02/09/2017 12:29  Iron Latest Ref Range: 42 - 163 ug/dL 664249 (H)  UIBC Latest Ref Range: 117 - 376 ug/dL 2 (L)  TIBC Latest Ref Range: 202 -  409 ug/dL 161251  %SAT Latest Ref Range: 20 - 55 % 99 (H)  Ferritin Latest Ref Range: 22 - 316 ng/ml 92     Impression and Plan:  76 year old gentleman with the following issues:  1. Hemochromatosis: diagnosis since the late 90s. He has been donating blood intermittently to prevent accumulation of iron and to prevent organ damage.  Iron studies obtained on 02/09/2017 were reviewed today and showed iron studies to be increasing at this time.  The plan is for him  to continue intermittent phlebotomies via blood donation [facility] every 3-6 months.  2. Liver function surveillance: His liver function test has been within normal range and will be repeated periodically.  3. Follow-up: Will be in 6 months and repeat his iron levels.   Eli HoseFiras Telena Peyser, MD 12/28/20181:41 PM

## 2017-03-09 NOTE — Telephone Encounter (Signed)
Gave patient avs and calendar with appts per 12/28 los °

## 2017-09-04 ENCOUNTER — Inpatient Hospital Stay: Payer: Medicare Other | Attending: Oncology

## 2017-09-04 LAB — COMPREHENSIVE METABOLIC PANEL
ALK PHOS: 99 U/L (ref 38–126)
ALT: 19 U/L (ref 0–44)
AST: 23 U/L (ref 15–41)
Albumin: 4.2 g/dL (ref 3.5–5.0)
Anion gap: 9 (ref 5–15)
BILIRUBIN TOTAL: 0.7 mg/dL (ref 0.3–1.2)
BUN: 17 mg/dL (ref 8–23)
CALCIUM: 9.8 mg/dL (ref 8.9–10.3)
CHLORIDE: 104 mmol/L (ref 98–111)
CO2: 24 mmol/L (ref 22–32)
CREATININE: 1.24 mg/dL (ref 0.61–1.24)
GFR calc non Af Amer: 55 mL/min — ABNORMAL LOW (ref >60)
Glucose, Bld: 126 mg/dL — ABNORMAL HIGH (ref 70–99)
Potassium: 3.8 mmol/L (ref 3.5–5.1)
Sodium: 137 mmol/L (ref 135–145)
TOTAL PROTEIN: 7.5 g/dL (ref 6.5–8.1)

## 2017-09-04 LAB — CBC WITH DIFFERENTIAL/PLATELET
BASOS ABS: 0 10*3/uL (ref 0.0–0.1)
Basophils Relative: 1 %
Eosinophils Absolute: 0.1 10*3/uL (ref 0.0–0.5)
Eosinophils Relative: 2 %
HEMATOCRIT: 45 % (ref 38.4–49.9)
Hemoglobin: 15.6 g/dL (ref 13.0–17.1)
LYMPHS ABS: 2 10*3/uL (ref 0.9–3.3)
LYMPHS PCT: 33 %
MCH: 34.1 pg — AB (ref 27.2–33.4)
MCHC: 34.6 g/dL (ref 32.0–36.0)
MCV: 98.7 fL — AB (ref 79.3–98.0)
MONO ABS: 0.6 10*3/uL (ref 0.1–0.9)
Monocytes Relative: 9 %
NEUTROS ABS: 3.5 10*3/uL (ref 1.5–6.5)
Neutrophils Relative %: 55 %
PLATELETS: 194 10*3/uL (ref 140–400)
RBC: 4.56 MIL/uL (ref 4.20–5.82)
RDW: 12.7 % (ref 11.0–14.6)
WBC: 6.2 10*3/uL (ref 4.0–10.3)

## 2017-09-04 LAB — IRON AND TIBC
Iron: 134 ug/dL (ref 42–163)
Saturation Ratios: 46 % (ref 42–163)
TIBC: 293 ug/dL (ref 202–409)
UIBC: 159 ug/dL

## 2017-09-04 LAB — FERRITIN: Ferritin: 45 ng/mL (ref 24–336)

## 2017-09-07 ENCOUNTER — Inpatient Hospital Stay: Payer: Medicare Other | Admitting: Oncology

## 2017-09-07 ENCOUNTER — Telehealth: Payer: Self-pay | Admitting: Oncology

## 2017-09-07 NOTE — Telephone Encounter (Signed)
Appointments scheduled AVS/Calendar rpinted per 6/28 los

## 2017-09-07 NOTE — Progress Notes (Signed)
Hematology and Oncology Follow Up Visit  Mark Booker [id number] 1940/08/16 77 y.o. 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  Current therapy: Intermittent phlebotomies via blood donation intermittently.  Interim History: Mr. Mark Booker is here for a follow-up visit.  He reports no major changes or complaints since the last visit.  He continues to donate blood every 6 months without any complications related to this process.  He denies any excessive fatigue or tiredness.  He denies any headaches or neurological deficits.  He feels better after donating blood with improved energy and improved exercise tolerance.  He denies any dizziness or lightheadedness associated with it.  He is no longer taking Prilosec with his acid reflux symptoms have resolved.   He does not report any headaches, blurry vision, syncope or seizures.  He denies any alteration in mental status or confusion.  He does not report any fevers or chills or sweats. He does not report any cough, wheezing or hemoptysis.  Does not report any chest pain or palpitation or leg edema.  He does not report any nausea, vomiting or abdominal pain.  Denies any change in his bowel habits or early satiety.  He does not report any frequency urgency or hesitancy. He does not report any arthralgias or myalgias.  He denies any lymphadenopathy or petechiae.  He denies any skin rashes or lesions. Remaining review of systems is negative.  Medications: I have reviewed the patient's current medications.  Current Outpatient Medications  Medication Sig Dispense Refill  . fish oil-omega-3 fatty acids 1000 MG capsule Take 1 g by mouth daily.    Marland Kitchen glucosamine-chondroitin 500-400 MG tablet Take 1 tablet by mouth daily.    Marland Kitchen omeprazole (PRILOSEC) 20 MG capsule Take 20 mg by mouth daily.     No current facility-administered medications  for this visit.      Allergies: Not on File  Past Medical History, Surgical history, Social history, and Family History unchanged on review.   Physical Exam: Blood pressure 125/81, pulse 60, temperature 97.7 F (36.5 C), temperature source Oral, resp. rate 17, height 6\' 1"  (1.854 m), weight 190 lb 12.8 oz (86.5 kg), SpO2 100 %.   ECOG: 0 General appearance: Alert, awake gentleman without distress. Head: Atraumatic without abnormalities. Oropharynx: Without any thrush or ulcers. Eyes: No scleral icterus. Lymph nodes: No lymphadenopathy noted in the cervical, supraclavicular, pr axillary nodes  Heart:regular rate and rhythm, without murmurs or leg edema. Lung: Clear to auscultation without any rhonchi, wheezes or dullness to percussion. Abdomin: soft, without any rebound or guarding.  Good bowel sounds auscultated.   Musculoskeletal: No clubbing or cyanosis. Neurological: No motor or sensory deficits.   Lab Results: Lab Results  Component Value Date   WBC 6.2 09/04/2017   HGB 15.6 09/04/2017   HCT 45.0 09/04/2017   MCV 98.7 (H) 09/04/2017   PLT 194 09/04/2017     Chemistry      Component Value Date/Time   NA 137 09/04/2017 1245   NA 139 08/17/2016 0851   K 3.8 09/04/2017 1245   K 4.4 08/17/2016 0851   CL 104 09/04/2017 1245   CO2 24 09/04/2017 1245   CO2 24 08/17/2016 0851   BUN 17 09/04/2017 1245   BUN 10.9 08/17/2016 0851   CREATININE 1.24 09/04/2017 1245   CREATININE 1.2 08/17/2016 0851      Component Value Date/Time   CALCIUM 9.8 09/04/2017  1245   CALCIUM 9.6 08/17/2016 0851   ALKPHOS 99 09/04/2017 1245   ALKPHOS 87 08/17/2016 0851   AST 23 09/04/2017 1245   AST 36 (H) 08/17/2016 0851   ALT 19 09/04/2017 1245   ALT 33 08/17/2016 0851   BILITOT 0.7 09/04/2017 1245   BILITOT 0.83 08/17/2016 0851       Results for Figuero, [name] (MRN [id number]) as of 09/07/2017 11:48  Ref. Range 09/04/2017 12:46  Iron Latest Ref Range: 42 - 163 ug/dL 409134  UIBC  Latest Units: ug/dL 811159  TIBC Latest Ref Range: 202 - 409 ug/dL 914293  Saturation Ratios Latest Ref Range: 42 - 163 % 46  Ferritin Latest Ref Range: 24 - 336 ng/mL 45    Impression and Plan:  77 year old man with:  1. Hemochromatosis: diagnosis since the late 90s and [facility].  He is currently donating blood periodically to keep his ferritin below 50.  Laboratory data obtained on 09/04/2017 was personally reviewed.  His iron studies are within normal range with ferritin of 45 iron of 134.  His liver function test all within normal range including a normal hemoglobin.  The natural course of this disease as well as treatment option for this condition were discussed.  Different strategies to manage this condition was reviewed as well.  The option is to continue with intermittent phlebotomy via blood donation versus observation and surveillance were reviewed.  After discussion today, I have recommended continuing phlebotomy twice a year with the caveat that this might result in decreasing his iron level in the future.  At that time, we could adjust his phlebotomy schedule to once a year if needed.  He is agreeable to this plan.    2. Liver function surveillance: Labs obtained on 09/04/2017 showed his liver function tests all within normal range.  3. Follow-up: Will be in 6 months for physical exam and repeat laboratory testing.  15  minutes was spent with the patient face-to-face today.  More than 50% of time was dedicated to patient counseling, education and discussing the natural course of this disease as well as future management options.    Eli HoseFiras Jaeven Wanzer, MD 6/28/201911:49 AM

## 2018-03-19 ENCOUNTER — Inpatient Hospital Stay: Payer: Medicare Other | Attending: Family Medicine

## 2018-03-19 LAB — CBC WITH DIFFERENTIAL (CANCER CENTER ONLY)
ABS IMMATURE GRANULOCYTES: 0.02 10*3/uL (ref 0.00–0.07)
BASOS PCT: 1 %
Basophils Absolute: 0 10*3/uL (ref 0.0–0.1)
EOS ABS: 0.1 10*3/uL (ref 0.0–0.5)
Eosinophils Relative: 1 %
HCT: 47.5 % (ref 39.0–52.0)
Hemoglobin: 16.2 g/dL (ref 13.0–17.0)
IMMATURE GRANULOCYTES: 0 %
LYMPHS ABS: 2.4 10*3/uL (ref 0.7–4.0)
Lymphocytes Relative: 31 %
MCH: 33.7 pg (ref 26.0–34.0)
MCHC: 34.1 g/dL (ref 30.0–36.0)
MCV: 98.8 fL (ref 80.0–100.0)
MONOS PCT: 12 %
Monocytes Absolute: 0.9 10*3/uL (ref 0.1–1.0)
NEUTROS ABS: 4.2 10*3/uL (ref 1.7–7.7)
NEUTROS PCT: 55 %
PLATELETS: 257 10*3/uL (ref 150–400)
RBC: 4.81 MIL/uL (ref 4.22–5.81)
RDW: 12.2 % (ref 11.5–15.5)
WBC Count: 7.7 10*3/uL (ref 4.0–10.5)
nRBC: 0 % (ref 0.0–0.2)

## 2018-03-19 LAB — IRON AND TIBC
IRON: 185 ug/dL — AB (ref 42–163)
Saturation Ratios: 68 % — ABNORMAL HIGH (ref 20–55)
TIBC: 273 ug/dL (ref 202–409)
UIBC: 88 ug/dL — ABNORMAL LOW (ref 117–376)

## 2018-03-19 LAB — FERRITIN: FERRITIN: 42 ng/mL (ref 24–336)

## 2018-03-21 ENCOUNTER — Inpatient Hospital Stay (HOSPITAL_BASED_OUTPATIENT_CLINIC_OR_DEPARTMENT_OTHER): Payer: Medicare Other | Admitting: Oncology

## 2018-03-21 ENCOUNTER — Telehealth: Payer: Self-pay

## 2018-03-21 NOTE — Progress Notes (Signed)
Hematology and Oncology Follow Up Visit  Andris FlurryRichard C Shew [id number] 07/12/1940 78 y.o. 03/21/2018 11:55 AM Manus GunningEhinger, Molly Maduroobert, MDEhinger, Molly Maduroobert, MD   Principle Diagnosis: 78 year old man with hemachromatosis establish care in 2013 after initial diagnosis in 1990.  Current therapy: Blood donation twice a year..  Interim History: [name] presents today for repeat evaluation.  Since last visit, he reports no major changes or complaints.  He continues to donate blood twice a year without any difficulties obtaining or doing it.  He denies any headaches or dizziness.  He denies any thrombosis or bleeding complications.  Continues to be active and attends to activities of daily living.   He does not report any headaches, blurry vision, syncope or seizures.  He denies any dizziness or lethargy.  He does not report any fevers or chills or sweats. He does not report any cough, wheezing or hemoptysis.  Does not report any chest pain or palpitation or leg edema.  He does not report any nausea, vomiting or distention.  Denies any constipation or diarrhea.  He does not report any frequency urgency or hesitancy. He does not report any bone pain or pathological fractures.  He denies any bleeding or clotting tendency.  He denies any ecchymosis or petechiae.  Remaining review of systems is negative.  Medications: I have reviewed the patient's current medications.  Current Outpatient Medications  Medication Sig Dispense Refill  . fish oil-omega-3 fatty acids 1000 MG capsule Take 1 g by mouth daily.    Marland Kitchen. glucosamine-chondroitin 500-400 MG tablet Take 1 tablet by mouth daily.     No current facility-administered medications for this visit.      Allergies: Not on File  Past Medical History, Surgical history, Social history, and Family History unchanged on review.   Physical Exam: Blood pressure 127/72, pulse 63, temperature (!) 97.5 F (36.4 C), temperature source Oral, resp. rate 17, height 6\' 1"  (1.854  m), weight 195 lb 9.6 oz (88.7 kg), SpO2 100 %.   ECOG: 0   General appearance: Comfortable appearing without any discomfort Head: Normocephalic without any trauma Oropharynx: Mucous membranes are moist and pink without any thrush or ulcers. Eyes: Pupils are equal and round reactive to light. Lymph nodes: No cervical, supraclavicular, inguinal or axillary lymphadenopathy.   Heart:regular rate and rhythm.  S1 and S2 without leg edema. Lung: Clear without any rhonchi or wheezes.  No dullness to percussion. Abdomin: Soft, nontender, nondistended with good bowel sounds.  No hepatosplenomegaly. Musculoskeletal: No joint deformity or effusion.  Full range of motion noted. Neurological: No deficits noted on motor, sensory and deep tendon reflex exam. Skin: No petechial rash or dryness.  Appeared moist.     Lab Results: Lab Results  Component Value Date   WBC 7.7 03/19/2018   HGB 16.2 03/19/2018   HCT 47.5 03/19/2018   MCV 98.8 03/19/2018   PLT 257 03/19/2018     Chemistry      Component Value Date/Time   NA 137 09/04/2017 1245   NA 139 08/17/2016 0851   K 3.8 09/04/2017 1245   K 4.4 08/17/2016 0851   CL 104 09/04/2017 1245   CO2 24 09/04/2017 1245   CO2 24 08/17/2016 0851   BUN 17 09/04/2017 1245   BUN 10.9 08/17/2016 0851   CREATININE 1.24 09/04/2017 1245   CREATININE 1.2 08/17/2016 0851      Component Value Date/Time   CALCIUM 9.8 09/04/2017 1245   CALCIUM 9.6 08/17/2016 0851   ALKPHOS 99 09/04/2017 1245  ALKPHOS 87 08/17/2016 0851   AST 23 09/04/2017 1245   AST 36 (H) 08/17/2016 0851   ALT 19 09/04/2017 1245   ALT 33 08/17/2016 0851   BILITOT 0.7 09/04/2017 1245   BILITOT 0.83 08/17/2016 0851       Results for Lemberger, DANELLE SANDSTROM (MRN [id number]) as of 03/21/2018 11:45  Ref. Range 03/19/2018 11:16  Iron Latest Ref Range: 42 - 163 ug/dL 076 (H)  UIBC Latest Ref Range: 117 - 376 ug/dL 88 (L)  TIBC Latest Ref Range: 202 - 409 ug/dL 226  Saturation Ratios Latest Ref  Range: 20 - 55 % 68 (H)  Ferritin Latest Ref Range: 24 - 336 ng/mL 42     Impression and Plan:  78 year old man with:  1. 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  He has been donating blood to keep his ferritin below 50.   Laboratory data obtained on March 19, 2018 were personally reviewed today and discussed with the patient.  His hemoglobin is within normal range with ferritin of 42.  Iron level slightly high but otherwise no other abnormalities.  Risks and benefits of continuing this approach was reviewed today and is agreeable to continue.  I recommended continuing blood donation twice a year.  More frequent phlebotomy may be needed if his ferritin starts to rise.  2. Liver function surveillance: Liver function test will be repeated in 6 months.  3. Follow-up: Will be in 6 months for a repeat evaluation.  15  minutes was spent with the patient face-to-face today.  More than 50% of time was dedicated to reviewing laboratory data, management options and coordinating plan of care.    Eli Hose, MD 1/9/202011:55 AM

## 2018-03-21 NOTE — Telephone Encounter (Signed)
Per 1/9 los patient asked not to be scheduled until he finds out what his summer plans are. And he will call to schedule at that time. Printed avs only

## 2019-04-07 ENCOUNTER — Ambulatory Visit: Payer: Medicare Other | Attending: Internal Medicine

## 2019-04-07 DIAGNOSIS — Z23 Encounter for immunization: Secondary | ICD-10-CM

## 2019-04-07 NOTE — Progress Notes (Signed)
   Covid-19 Vaccination Clinic  Name:  MARTIE FULGHAM    MRN: [id number] DOB: Jan 27, 1941  04/07/2019  Mr. Rundle was observed post Covid-19 immunization for 15 minutes without incidence. He was provided with Vaccine Information Sheet and instruction to access the V-Safe system.   Mr. Verstraete was instructed to call 911 with any severe reactions post vaccine: Marland Kitchen Difficulty breathing  . Swelling of your face and throat  . A fast heartbeat  . A bad rash all over your body  . Dizziness and weakness    Immunizations Administered    Name Date Dose VIS Date Route   Pfizer COVID-19 Vaccine 04/07/2019  9:31 AM 0.3 mL 02/21/2019 Intramuscular   Manufacturer: [employer], [employer]   Lot: [id number]   NDC: [address]-8

## 2019-04-25 ENCOUNTER — Ambulatory Visit: Payer: Medicare Other | Attending: Internal Medicine

## 2019-04-25 DIAGNOSIS — Z23 Encounter for immunization: Secondary | ICD-10-CM

## 2019-04-25 NOTE — Progress Notes (Signed)
   Covid-19 Vaccination Clinic  Name:  Mark Booker    MRN: [id number] DOB: [birth year]  04/25/2019  [name] was observed post Covid-19 immunization for 15 minutes without incidence. He was provided with Vaccine Information Sheet and instruction to access the V-Safe system.   [name] was instructed to call 911 with any severe reactions post vaccine: Marland Kitchen Difficulty breathing  . Swelling of your face and throat  . A fast heartbeat  . A bad rash all over your body  . Dizziness and weakness    Immunizations Administered    Name Date Dose VIS Date Route   Pfizer COVID-19 Vaccine 04/25/2019  8:17 AM 0.3 mL 02/21/2019 Intramuscular   Manufacturer: [employer], [employer]   Lot: [id number]   NDC: [address]-4

## 2020-01-05 ENCOUNTER — Ambulatory Visit: Payer: Medicare Other

## 2021-09-19 ENCOUNTER — Ambulatory Visit (INDEPENDENT_AMBULATORY_CARE_PROVIDER_SITE_OTHER): Payer: Medicare Other | Admitting: Allergy

## 2021-09-19 ENCOUNTER — Encounter: Payer: Self-pay | Admitting: Allergy

## 2021-09-19 VITALS — BP 110/74 | HR 63 | Temp 98.2°F | Resp 16 | Ht 73.0 in | Wt 181.4 lb

## 2021-09-19 DIAGNOSIS — J329 Chronic sinusitis, unspecified: Secondary | ICD-10-CM

## 2021-09-19 DIAGNOSIS — N486 Induration penis plastica: Secondary | ICD-10-CM | POA: Insufficient documentation

## 2021-09-19 DIAGNOSIS — M545 Low back pain, unspecified: Secondary | ICD-10-CM | POA: Insufficient documentation

## 2021-09-19 DIAGNOSIS — R519 Headache, unspecified: Secondary | ICD-10-CM | POA: Insufficient documentation

## 2021-09-19 DIAGNOSIS — K219 Gastro-esophageal reflux disease without esophagitis: Secondary | ICD-10-CM | POA: Insufficient documentation

## 2021-09-19 DIAGNOSIS — H409 Unspecified glaucoma: Secondary | ICD-10-CM | POA: Insufficient documentation

## 2021-09-19 DIAGNOSIS — N529 Male erectile dysfunction, unspecified: Secondary | ICD-10-CM | POA: Insufficient documentation

## 2021-09-19 DIAGNOSIS — M1990 Unspecified osteoarthritis, unspecified site: Secondary | ICD-10-CM | POA: Insufficient documentation

## 2021-09-19 DIAGNOSIS — L409 Psoriasis, unspecified: Secondary | ICD-10-CM | POA: Insufficient documentation

## 2021-09-19 DIAGNOSIS — Z77090 Contact with and (suspected) exposure to asbestos: Secondary | ICD-10-CM | POA: Insufficient documentation

## 2021-09-19 DIAGNOSIS — J310 Chronic rhinitis: Secondary | ICD-10-CM | POA: Diagnosis not present

## 2021-09-19 DIAGNOSIS — B999 Unspecified infectious disease: Secondary | ICD-10-CM

## 2021-09-19 DIAGNOSIS — Z86010 Personal history of colonic polyps: Secondary | ICD-10-CM | POA: Insufficient documentation

## 2021-09-19 DIAGNOSIS — E781 Pure hyperglyceridemia: Secondary | ICD-10-CM | POA: Insufficient documentation

## 2021-09-19 MED ORDER — RYALTRIS 665-25 MCG/ACT NA SUSP: 1.0000 | Freq: Two times a day (BID) | NASAL | 5 refills | Status: DC

## 2021-09-19 NOTE — Assessment & Plan Note (Signed)
Perennial rhinitis symptoms for 20+ years but worsening.  Patient was on AIT for dust mite and mold 20 years ago with good benefit.  History of sinus surgery 30 years ago.  Concerned if headaches and frequent sinusitis are due to new allergies.  Unable to skin test today due to recent antihistamine intake.  Get bloodwork - if all negative will recommend ENT evaluation next and skin prick testing depending on IgE level.   Use over the counter antihistamines such as Claritin (loratadine), Allegra (fexofenadine), or Xyzal (levocetirizine) daily as needed. May switch antihistamines every few months.  Start Ryaltris (olopatadine + mometasone nasal spray combination) 1-2 sprays per nostril twice a day. Sample given.  This replaces your other nasal sprays.  If this works well for you, then have [facility] ship the medication to your home - prescription already sent in.   Nasal saline spray (i.e., Simply Saline) or nasal saline lavage (i.e., NeilMed) is recommended as needed and prior to medicated nasal sprays.

## 2021-09-19 NOTE — Progress Notes (Signed)
New Patient Note  RE: Mark Booker MRN: [id number] DOB: [birth year] Date of Office Visit: 09/19/2021  Consult requested by: Blair Heys, MD Primary care provider: Blair Heys, MD  Chief Complaint: Establish Care and Allergies (Was on allergy shots 20 years ago)  History of Present Illness: I had the pleasure of seeing Mark Booker for initial evaluation at the [facility] on 09/19/2021. He is a 81 y.o. male, who is referred here by Blair Heys, MD for the evaluation of allergic rhinitis.  He reports symptoms of frequent sinus infections, sneezing, rhinorrhea, PND, sore throat. Symptoms have been going on for 20+ years and worsening lately. The symptoms are present all year around with worsening in spring and fall. Other triggers include exposure to outdoors, dust. Anosmia: no. Headache: yes. He has used zyrtec, Xlear, saline wash, azelastine with some improvement in symptoms. Sinus infections: 2-3 times per year. Previous work up includes: patient was on AIT for dust and mold for 5 years about 20 years ago. Symptoms started to come back about 5 years after stopping the injections.  He usually has to wear a mask when doing yardwork outdoors.  Previous ENT evaluation: yes a few years ago, had sinus surgery about 30 years ago.  Previous sinus imaging: no. History of nasal polyps: no. Last eye exam: twice per year. History of reflux: yes and takes omeprazole daily.  Assessment and Plan: [place] is a 81 y.o. male with: Chronic rhinitis Perennial rhinitis symptoms for 20+ years but worsening.  Patient was on AIT for dust mite and mold 20 years ago with good benefit.  History of sinus surgery 30 years ago.  Concerned if headaches and frequent sinusitis are due to new allergies. Unable to skin test today due to recent antihistamine intake. Get bloodwork - if all negative will recommend ENT evaluation next and skin prick testing depending on IgE level.  Use over  the counter antihistamines such as Claritin (loratadine), Allegra (fexofenadine), or Xyzal (levocetirizine) daily as needed. May switch antihistamines every few months. Start Ryaltris (olopatadine + mometasone nasal spray combination) 1-2 sprays per nostril twice a day. Sample given. This replaces your other nasal sprays. If this works well for you, then have [facility] ship the medication to your home - prescription already sent in.  Nasal saline spray (i.e., Simply Saline) or nasal saline lavage (i.e., NeilMed) is recommended as needed and prior to medicated nasal sprays.  Recurrent sinusitis 2-3 sinus infections per year requiring antibiotics.  Sinus surgery 30 years ago.  No recent ENT evaluation. Keep track of infections and antibiotics use. Get bloodwork to look at immune system.  Generalized headache Headaches 6 days out of the week.  Recommend neurology evaluation next.   Return in about 2 months (around 11/20/2021).  Meds ordered this encounter  Medications   Olopatadine-Mometasone (RYALTRIS) 665-25 MCG/ACT SUSP    Sig: Place 1-2 sprays into the nose in the morning and at bedtime.    Dispense:  29 g    Refill:  5    [phone]   Lab Orders         Allergens w/Total IgE Area 2         CBC with Differential/Platelet         Complement, total         Strep pneumoniae 23 Serotypes IgG         Diphtheria / Tetanus Antibody Panel         IgG, IgA, IgM  Other allergy screening: Asthma: no Food allergy: no Medication allergy: no Hymenoptera allergy: no Urticaria: no Eczema:no History of recurrent infections suggestive of immunodeficency: no  Diagnostics: Skin Testing: Unable to skin test today due to recent antihistamine intake.  Results discussed with patient/family.   Past Medical History: Patient Active Problem List   Diagnosis Date Noted   Erectile dysfunction 09/19/2021   Osteoarthritis 09/19/2021   Lower back pain 09/19/2021   History of asbestos  exposure 09/19/2021   Gastroesophageal reflux disease 09/19/2021   Glaucoma 09/19/2021   Hemochromatosis 09/19/2021   History of adenomatous polyp of colon 09/19/2021   Hypertriglyceridemia 09/19/2021   Peyronie's disease 09/19/2021   Psoriasis 09/19/2021   Chronic rhinitis 09/19/2021   Recurrent sinusitis 09/19/2021   Generalized headache 09/19/2021   History reviewed. No pertinent past medical history. Past Surgical History: History reviewed. No pertinent surgical history. Medication List:  Current Outpatient Medications  Medication Sig Dispense Refill   cetirizine (ZYRTEC) 10 MG tablet Take 10 mg by mouth daily.     docusate sodium (STOOL SOFTENER) 100 MG capsule 1 capsule as needed     fish oil-omega-3 fatty acids 1000 MG capsule Take 1 g by mouth daily.     glucosamine-chondroitin 500-400 MG tablet Take 1 tablet by mouth daily.     latanoprost (XALATAN) 0.005 % ophthalmic solution 1 drop at bedtime.     Olopatadine-Mometasone (RYALTRIS) X543819 MCG/ACT SUSP Place 1-2 sprays into the nose in the morning and at bedtime. 29 g 5   omeprazole (PRILOSEC) 20 MG capsule Take by mouth.     No current facility-administered medications for this visit.   Allergies: Allergies  Allergen Reactions   Amoxicillin-Pot Clavulanate     Other reaction(s): GI   Azelastine Hcl     Other reaction(s): "makes food tastes like medicine"   Dust Mite Extract     Other reaction(s): Nasal congestion   Molds & Smuts     Other reaction(s): Nasal congestion   Tizanidine Hcl     Other reaction(s): sore throat (11/2017)   Social History: Social History   Socioeconomic History   Marital status: Married    Spouse name: Not on file   Number of children: Not on file   Years of education: Not on file   Highest education level: Not on file  Occupational History   Not on file  Tobacco Use   Smoking status: Never   Smokeless tobacco: Never  Substance and Sexual Activity   Alcohol use: Not Currently    Drug use: Not on file   Sexual activity: Not on file  Other Topics Concern   Not on file  Social History Narrative   Not on file   Social Determinants of Health   Financial Resource Strain: Not on file  Food Insecurity: Not on file  Transportation Needs: Not on file  Physical Activity: Not on file  Stress: Not on file  Social Connections: Not on file   Lives in a 80 year old house. Smoking: denies Occupation: retired  Landscape architect HistorySurveyor, minerals in the house: no Engineer, civil (consulting) in the family room: yes Carpet in the bedroom: no Heating: heat pump Cooling: heat pump Pet: no  Family History: History reviewed. No pertinent family history. Problem                               Relation Asthma  no Eczema                                no Food allergy                          no Allergic rhino conjunctivitis     no  Review of Systems  Constitutional:  Negative for appetite change, chills, fever and unexpected weight change.  HENT:  Positive for postnasal drip, rhinorrhea and sneezing. Negative for congestion.   Eyes:  Negative for itching.  Respiratory:  Negative for cough, chest tightness, shortness of breath and wheezing.   Cardiovascular:  Negative for chest pain.  Gastrointestinal:  Negative for abdominal pain.  Genitourinary:  Negative for difficulty urinating.  Skin:  Negative for rash.  Neurological:  Positive for headaches.    Objective: BP 110/74   Pulse 63   Temp 98.2 F (36.8 C)   Resp 16   Ht 6\' 1"  (1.854 m)   Wt 181 lb 6 oz (82.3 kg)   SpO2 98%   BMI 23.93 kg/m  Body mass index is 23.93 kg/m. Physical Exam Vitals and nursing note reviewed.  Constitutional:      Appearance: Normal appearance. He is well-developed.  HENT:     Head: Normocephalic and atraumatic.     Right Ear: Tympanic membrane and external ear normal.     Left Ear: Tympanic membrane and external ear normal.     Nose: Nose normal.      Mouth/Throat:     Mouth: Mucous membranes are moist.     Pharynx: Oropharynx is clear.  Eyes:     Conjunctiva/sclera: Conjunctivae normal.  Cardiovascular:     Rate and Rhythm: Normal rate and regular rhythm.     Heart sounds: Normal heart sounds. No murmur heard.    No friction rub. No gallop.  Pulmonary:     Effort: Pulmonary effort is normal.     Breath sounds: Normal breath sounds. No wheezing, rhonchi or rales.  Musculoskeletal:     Cervical back: Neck supple.  Skin:    General: Skin is warm.     Findings: No rash.  Neurological:     Mental Status: He is alert and oriented to person, place, and time.  Psychiatric:        Behavior: Behavior normal.   The plan was reviewed with the patient/family, and all questions/concerned were addressed.  It was my pleasure to see Mark Booker today and participate in his care. Please feel free to contact me with any questions or concerns.  Sincerely,  Gerlene Burdock, DO Allergy & Immunology  [facility] office: [phone] [facility] office: [phone]

## 2021-09-19 NOTE — Patient Instructions (Addendum)
Rhinitis:  Unable to skin test today due to recent antihistamine intake. Get bloodwork - if all negative will recommend ENT evaluation next.  We are ordering labs, so please allow 1-2 weeks for the results to come back. With the newly implemented Cures Act, the labs might be visible to you at the same time that they become visible to me. However, I will not address the results until all of the results are back, so please be patient.  In the meantime, continue recommendations in your patient instructions, including avoidance measures (if applicable), until you hear from me. Use over the counter antihistamines such as Claritin (loratadine), Allegra (fexofenadine), or Xyzal (levocetirizine) daily as needed. May switch antihistamines every few months. Start Ryaltris (olopatadine + mometasone nasal spray combination) 1-2 sprays per nostril twice a day. Sample given. This replaces your other nasal sprays. If this works well for you, then have [facility] ship the medication to your home - prescription already sent in.  Nasal saline spray (i.e., Simply Saline) or nasal saline lavage (i.e., NeilMed) is recommended as needed and prior to medicated nasal sprays.  Sinus infections Keep track of infections and antibiotics use. Get bloodwork to look at immune system.  Headaches Recommend neurology evaluation next as well.   Follow up in 2 months or sooner if needed.

## 2021-09-19 NOTE — Assessment & Plan Note (Signed)
Headaches 6 days out of the week.  . Recommend neurology evaluation next.

## 2021-09-19 NOTE — Assessment & Plan Note (Signed)
2-3 sinus infections per year requiring antibiotics.  Sinus surgery 30 years ago.  No recent ENT evaluation.  Keep track of infections and antibiotics use.  Get bloodwork to look at immune system.

## 2021-09-23 LAB — ALLERGENS W/TOTAL IGE AREA 2

## 2021-09-23 LAB — CBC WITH DIFFERENTIAL/PLATELET
Basophils Absolute: 0 10*3/uL (ref 0.0–0.2)
Basos: 1 %
EOS (ABSOLUTE): 0.1 10*3/uL (ref 0.0–0.4)
Eos: 2 %
Hematocrit: 41.6 % (ref 37.5–51.0)
Hemoglobin: 13.3 g/dL (ref 13.0–17.7)
Immature Grans (Abs): 0 10*3/uL (ref 0.0–0.1)
Immature Granulocytes: 0 %
Lymphocytes Absolute: 1.2 10*3/uL (ref 0.7–3.1)
Lymphs: 21 %
MCH: 30 pg (ref 26.6–33.0)
MCHC: 32 g/dL (ref 31.5–35.7)
MCV: 94 fL (ref 79–97)
Monocytes Absolute: 0.5 10*3/uL (ref 0.1–0.9)
Monocytes: 9 %
Neutrophils Absolute: 4 10*3/uL (ref 1.4–7.0)
Neutrophils: 67 %
Platelets: 268 10*3/uL (ref 150–450)
RBC: 4.44 x10E6/uL (ref 4.14–5.80)
RDW: 13.2 % (ref 11.6–15.4)
WBC: 5.9 10*3/uL (ref 3.4–10.8)

## 2021-09-23 LAB — COMPLEMENT, TOTAL: Compl, Total (CH50): >60 U/mL (ref >41)

## 2021-09-23 LAB — STREP PNEUMONIAE 23 SEROTYPES IGG
Pneumo Ab Type 1*: 1.7 ug/mL (ref >1.3)
Pneumo Ab Type 12 (12F)*: <0.1 ug/mL — ABNORMAL LOW (ref >1.3)
Pneumo Ab Type 14*: 1.7 ug/mL (ref >1.3)
Pneumo Ab Type 17 (17F)*: 2.3 ug/mL (ref >1.3)
Pneumo Ab Type 19 (19F)*: 4.1 ug/mL (ref >1.3)
Pneumo Ab Type 2*: 5.3 ug/mL (ref >1.3)
Pneumo Ab Type 20*: 3.1 ug/mL (ref >1.3)
Pneumo Ab Type 22 (22F)*: 2.8 ug/mL (ref >1.3)
Pneumo Ab Type 23 (23F)*: 0.6 ug/mL — ABNORMAL LOW (ref >1.3)
Pneumo Ab Type 26 (6B)*: <0.1 ug/mL — ABNORMAL LOW (ref >1.3)
Pneumo Ab Type 3*: 0.8 ug/mL — ABNORMAL LOW (ref >1.3)
Pneumo Ab Type 34 (10A)*: 4.2 ug/mL (ref >1.3)
Pneumo Ab Type 4*: <0.1 ug/mL — ABNORMAL LOW (ref >1.3)
Pneumo Ab Type 43 (11A)*: 0.5 ug/mL — ABNORMAL LOW (ref >1.3)
Pneumo Ab Type 5*: 11.5 ug/mL (ref >1.3)
Pneumo Ab Type 51 (7F)*: 0.8 ug/mL — ABNORMAL LOW (ref >1.3)
Pneumo Ab Type 54 (15B)*: 0.3 ug/mL — ABNORMAL LOW (ref >1.3)
Pneumo Ab Type 56 (18C)*: 1.5 ug/mL (ref >1.3)
Pneumo Ab Type 57 (19A)*: 1.4 ug/mL (ref >1.3)
Pneumo Ab Type 68 (9V)*: 1.5 ug/mL (ref >1.3)
Pneumo Ab Type 70 (33F)*: 9.5 ug/mL (ref >1.3)
Pneumo Ab Type 8*: 3.2 ug/mL (ref >1.3)
Pneumo Ab Type 9 (9N)*: 2 ug/mL (ref >1.3)

## 2021-09-23 LAB — IGG, IGA, IGM
IgA/Immunoglobulin A, Serum: 260 mg/dL (ref 61–437)
IgG (Immunoglobin G), Serum: 995 mg/dL (ref 603–1613)
IgM (Immunoglobulin M), Srm: 59 mg/dL (ref 15–143)

## 2021-09-23 LAB — DIPHTHERIA / TETANUS ANTIBODY PANEL
Diphtheria Ab: 1.11 IU/mL (ref <0.10)
Tetanus Ab, IgG: 3.14 IU/mL (ref <0.10)

## 2021-09-26 NOTE — Progress Notes (Signed)
Please call patient.  I reviewed the bloodwork. Blood count, immunoglobulin levels (antibodies made by the immune system), diptheria & tetanus titers and pneumococcal titers (checks for how the immune system is functioning) were all normal which is great. No immunodeficiency issues based on these results.   Keep track of infections/antibiotics use.   Your environmental allergy panel was negative to indoor/outdoor allergens. I recommend ENT evaluation next based on these results. 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 Thank you.)  You may still use the nasal spray as needed.   I also recommend neurology evaluation for your headaches.

## 2021-09-27 ENCOUNTER — Telehealth: Payer: Self-pay

## 2021-09-27 NOTE — Telephone Encounter (Signed)
Per provider...  Please place ENT referral to [facility] for the following: - Chronic non-allergic rhinitis - History of sinus surgery - Rule out anatomical issues   Thanks!

## 2021-10-06 NOTE — Telephone Encounter (Signed)
Patient has been informed of his referral to [facility]. Patient states he would like to hold off on the referral for the headaches as he thinks he figured out what triggers them. I told him to call us back and we can always open that referral back up if anything changes.  Thanks   [facility] [place] Formerly known as [facility] [address] [place] [address] Phone: [phone])

## 2021-11-07 ENCOUNTER — Ambulatory Visit: Payer: Medicare Other | Admitting: Podiatry

## 2021-11-21 ENCOUNTER — Ambulatory Visit: Payer: Medicare Other | Admitting: Allergy

## 2022-01-09 ENCOUNTER — Ambulatory Visit
Admission: EM | Admit: 2022-01-09 | Discharge: 2022-01-09 | Disposition: A | Discharge status: Home / Self Care | Payer: Medicare Other | Attending: Emergency Medicine | Admitting: Emergency Medicine

## 2022-01-09 DIAGNOSIS — Z23 Encounter for immunization: Secondary | ICD-10-CM

## 2022-01-09 DIAGNOSIS — S81011A Laceration without foreign body, right knee, initial encounter: Secondary | ICD-10-CM | POA: Diagnosis not present

## 2022-01-09 MED ORDER — TETANUS-DIPHTH-ACELL PERTUSSIS 5-2.5-18.5 LF-MCG/0.5 IM SUSY
0.5000 mL | PREFILLED_SYRINGE | Freq: Once | INTRAMUSCULAR | Status: AC
  Administered 2022-01-09: 0.5 mL via INTRAMUSCULAR

## 2022-01-09 MED ORDER — CEPHALEXIN 500 MG PO CAPS: 500.0000 mg | ORAL_CAPSULE | Freq: Four times a day (QID) | ORAL | 0 refills | Status: DC

## 2022-01-09 NOTE — ED Provider Notes (Signed)
Mark Booker    CSN: [id number] Arrival date & time: 01/09/22  1744      History   Chief Complaint Chief Complaint  Patient presents with   Laceration    HPI Mark Booker is a 81 y.o. male.  Accompanied by his wife, patient presents with a laceration on his right anterior knee which occurred this afternoon while he was using a chainsaw.  Bleeding controlled with direct pressure.  He is not on any anticoagulants.  He denies numbness, weakness, paresthesias, or other symptoms.  Last tetanus 2012.  The history is provided by the patient and medical records.    History reviewed. No pertinent past medical history.  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 Authorizing Provider  cephALEXin (KEFLEX) 500 MG capsule Take 1 capsule (500 mg total) by mouth 4 (four) times daily. 01/09/22   Sharion Balloon, NP  cetirizine (ZYRTEC) 10 MG tablet Take 10 mg by mouth daily.    [provider]  docusate sodium (STOOL SOFTENER) 100 MG capsule 1 capsule as needed    [provider]  fish oil-omega-3 fatty acids 1000 MG capsule Take 1 g by mouth daily.    [provider]  glucosamine-chondroitin 500-400 MG tablet Take 1 tablet by mouth daily.    [provider]  latanoprost (XALATAN) 0.005 %  ophthalmic solution 1 drop at bedtime. 08/04/21   [provider]  Olopatadine-Mometasone (Rennie Plowman) [phone] MCG/ACT SUSP Place 1-2 sprays into the nose in the morning and at bedtime. 09/19/21   Garnet Sierras, DO  omeprazole (PRILOSEC) 20 MG capsule Take by mouth. 07/02/21   [provider]    Family History History reviewed. No pertinent family history.  Social History Social History   Tobacco Use   Smoking status: Never   Smokeless tobacco: Never  Substance Use Topics   Alcohol use: Not Currently   Drug use: Never     Allergies   Amoxicillin-pot clavulanate, Azelastine hcl, Dust mite extract, Molds & smuts, and Tizanidine hcl   Review of Systems Review of Systems  Constitutional:  Negative for chills and fever.  Musculoskeletal:  Negative for arthralgias, gait problem and joint swelling.  Skin:  Positive for wound. Negative for color change.  Neurological:  Negative for weakness and numbness.  All other systems reviewed and are negative.    Physical Exam Triage Vital Signs ED Triage Vitals  Enc Vitals Group     BP 01/09/22 1805 126/68     Pulse Rate 01/09/22 1748 98     Resp 01/09/22 1748 18     Temp 01/09/22 1748 98.1 F (36.7 C)     Temp src --      SpO2 01/09/22 1748 97 %     Weight 01/09/22 1802 175 lb (79.4 kg)  Height 01/09/22 1802 6\' 1"  (1.854 m)     Head Circumference --      Peak Flow --      Pain Score --      Pain Loc --      Pain Edu? --      Excl. in [place]? --    No data found.  Updated Vital Signs BP 126/68   Pulse 98   Temp 98.1 F (36.7 C)   Resp 18   Ht 6\' 1"  (1.854 m)   Wt 175 lb (79.4 kg)   SpO2 97%   BMI 23.09 kg/m   Visual Acuity Right Eye Distance:   Left Eye Distance:   Bilateral Distance:    Right Eye Near:   Left Eye Near:    Bilateral Near:     Physical Exam Vitals and nursing note reviewed.  Constitutional:      General: He is not in acute distress.    Appearance: Normal appearance. He is  well-developed. He is not ill-appearing.  HENT:     Mouth/Throat:     Mouth: Mucous membranes are moist.  Cardiovascular:     Rate and Rhythm: Normal rate and regular rhythm.     Heart sounds: Normal heart sounds.  Pulmonary:     Effort: Pulmonary effort is normal. No respiratory distress.     Breath sounds: Normal breath sounds.  Musculoskeletal:        General: No deformity. Normal range of motion.     Cervical back: Neck supple.  Skin:    General: Skin is warm and dry.     Capillary Refill: Capillary refill takes less than 2 seconds.     Findings: Lesion present.     Comments: 3 cm laceration on anterior right knee. Bleeding controlled.   Neurological:     General: No focal deficit present.     Mental Status: He is alert and oriented to person, place, and time.     Sensory: No sensory deficit.     Motor: No weakness.     Gait: Gait normal.  Psychiatric:        Mood and Affect: Mood normal.        Behavior: Behavior normal.      UC Treatments / Results  Labs (all labs ordered are listed, but only abnormal results are displayed) Labs Reviewed - No data to display  EKG   Radiology No results found.  Procedures Laceration Repair  Date/Time: 01/09/2022 7:03 PM  Performed by: Sharion Balloon, NP Authorized by: Sharion Balloon, NP   Consent:    Consent obtained:  Verbal   Consent given by:  Patient   Risks discussed:  Infection, pain, poor cosmetic result and poor wound healing Universal protocol:    Procedure explained and questions answered to patient or proxy's satisfaction: yes   Laceration details:    Location:  Leg   Leg location:  R knee   Length (cm):  3   Depth (mm):  2 Pre-procedure details:    Preparation:  Patient was prepped and draped in usual sterile fashion Exploration:    Hemostasis achieved with:  Direct pressure   Imaging outcome: foreign body not noted     Wound exploration: wound explored through full range of motion and entire depth of  wound visualized   Treatment:    Area cleansed with:  Shur-Clens   Amount of cleaning:  Standard   Irrigation solution:  Sterile water   Irrigation method:  Syringe   Visualized foreign bodies/material removed: no   Skin repair:    Repair method:  Sutures   Suture size:  3-0   Suture material:  Prolene   Suture technique:  Simple interrupted   Number of sutures:  7 Approximation:    Approximation:  Close Repair type:    Repair type:  Simple Post-procedure details:    Dressing:  Antibiotic ointment and non-adherent dressing   Procedure completion:  Tolerated well, no immediate complications  (including critical care time)  Medications Ordered in UC Medications  Tdap (BOOSTRIX) injection 0.5 mL (0.5 mLs Intramuscular Given 01/09/22 1826)    Initial Impression / Assessment and Plan / UC Course  I have reviewed the triage vital signs and the nursing notes.  Pertinent labs & imaging results that were available during my care of the patient were reviewed by me and considered in my medical decision making (see chart for details).   Laceration of right knee.  7 sutures.  Tetanus updated.  Treating with cephalexin.  Wound care instructions and signs of infection discussed.  Instructed patient to return here in 7 to 10 days for suture removal.  Instructed him to follow-up right away if he notes signs of infection.  Education provided on laceration care.  Patient agrees to plan of care.   Final Clinical Impressions(s) / UC Diagnoses   Final diagnoses:  Laceration of right knee, initial encounter     Discharge Instructions      Your tetanus was updated today.    Take the antibiotic as directed.    Keep your wound clean and dry.  Wash it gently twice a day with soap and water.  Apply an antibiotic cream twice a day for 2-3 days.    Your stitches need to be removed in 7-10 days.    Follow up right away if you see signs of infection, such as increased pain, redness, pus-like  drainage, warmth, fever, chills, or other concerning symptoms.        ED Prescriptions     Medication Sig Dispense Auth. Provider   cephALEXin (KEFLEX) 500 MG capsule  (Status: Discontinued) Take 1 capsule (500 mg total) by mouth 4 (four) times daily. 28 capsule Barkley Boards H, NP   cephALEXin (KEFLEX) 500 MG capsule Take 1 capsule (500 mg total) by mouth 4 (four) times daily. 28 capsule Sharion Balloon, NP      PDMP not reviewed this encounter.   Sharion Balloon, NP 01/09/22 1905

## 2022-01-09 NOTE — ED Triage Notes (Signed)
Patient to Urgent Care with complaints of laceration present to right knee cap, injury occurred this afternoon with a chain saw. Approx 2 inch laceration- bleeding controlled with constant pressure applied. Denies any blood thinner usage.   Last TDAP 2012.

## 2022-01-09 NOTE — Discharge Instructions (Addendum)
Your tetanus was updated today.    Take the antibiotic as directed.    Keep your wound clean and dry.  Wash it gently twice a day with soap and water.  Apply an antibiotic cream twice a day for 2-3 days.    Your stitches need to be removed in 7-10 days.    Follow up right away if you see signs of infection, such as increased pain, redness, pus-like drainage, warmth, fever, chills, or other concerning symptoms.

## 2022-01-16 ENCOUNTER — Ambulatory Visit
Admission: EM | Admit: 2022-01-16 | Discharge: 2022-01-16 | Disposition: A | Discharge status: Home / Self Care | Payer: Medicare Other | Attending: Internal Medicine | Admitting: Internal Medicine

## 2022-01-16 VITALS — BP 123/87 | HR 86 | Temp 98.1°F | Resp 15

## 2022-01-16 DIAGNOSIS — Z4802 Encounter for removal of sutures: Secondary | ICD-10-CM

## 2022-01-16 DIAGNOSIS — S81011D Laceration without foreign body, right knee, subsequent encounter: Secondary | ICD-10-CM

## 2022-01-16 NOTE — ED Triage Notes (Addendum)
Provider assessed and evaluated wound site as well. 6 stiches removed, steri strips applied for reinforcement by provider . Pt. Stated he had 7 stitches placed but one stitch came out at home yesterday.Marland Kitchen

## 2022-01-16 NOTE — ED Triage Notes (Signed)
patient here for suture removal for laceration on 01/09/2022.

## 2022-01-16 NOTE — ED Provider Notes (Signed)
Evaluation of the patient at the request of nurse who was tasked with removing the sutures.  She was concerned regarding the integrity of the sutured skin.  The wound remains well approximated and intact following removal of all 7 sutures.  There is some erythema surrounding the wound but no warmth or swelling.  He is completing a course of cephalexin.  There is no discharge from the wound.  Applied Steri-Strips to provide additional stability to the wound and asked him to try to leave them in place for a few more days.   Rose Phi, [place] 01/16/22 1139

## 2022-01-22 NOTE — Discharge Instructions (Signed)
Follow up here or with your primary care provider if you have additional concerns regarding your wound including if you have signs and symptoms of infection.

## 2022-01-22 NOTE — ED Provider Notes (Addendum)
Renaldo Fiddler    CSN: [id number] Arrival date & time: 01/16/22  1053      History   Chief Complaint Chief Complaint  Patient presents with   Suture / Staple Removal    HPI Mark Booker is a 81 y.o. male.    Suture / Staple Removal    Presents for removal of sutured placed on 01/09/2022.   No past medical history on file.  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 Authorizing Provider  cephALEXin (KEFLEX) 500 MG capsule Take 1 capsule (500 mg total) by mouth 4 (four) times daily. 01/09/22   Mickie Bail, NP  cetirizine (ZYRTEC) 10 MG tablet Take 10 mg by mouth daily.    [provider]  docusate sodium (STOOL SOFTENER) 100 MG capsule 1 capsule as needed    [provider]  fish oil-omega-3 fatty acids 1000 MG capsule Take 1 g by mouth daily.    [provider]  glucosamine-chondroitin 500-400 MG tablet Take 1 tablet by mouth daily.    [provider]  latanoprost (XALATAN) 0.005 % ophthalmic solution 1 drop at bedtime. 08/04/21   [provider]  Olopatadine-Mometasone (Cristal Generous) [phone] MCG/ACT SUSP Place 1-2 sprays into the nose in the morning and at bedtime. 09/19/21   Ellamae Sia, DO  omeprazole (PRILOSEC) 20 MG capsule Take by mouth. 07/02/21   [provider]    Family History No family history on file.  Social History Social History   Tobacco Use   Smoking status: Never   Smokeless tobacco: Never  Substance Use Topics   Alcohol use: Not Currently   Drug use: Never     Allergies   Amoxicillin-pot clavulanate, Azelastine hcl, Dust mite extract, Molds & smuts, and Tizanidine hcl   Review of Systems Review of Systems   Physical Exam Triage Vital Signs ED Triage Vitals [01/16/22 1124]  Enc Vitals Group     BP 123/87     Pulse Rate 86     Resp 15     Temp 98.1 F (36.7 C)     Temp src      SpO2 96 %     Weight      Height      Head Circumference      Peak Flow      Pain Score      Pain Loc      Pain Edu?      Excl. in [place]?    No data found.  Updated Vital Signs BP 123/87   Pulse 86   Temp 98.1 F (36.7 C)   Resp 15   SpO2 96%   Visual Acuity Right Eye Distance:   Left Eye Distance:   Bilateral Distance:    Right Eye Near:   Left Eye Near:  Bilateral Near:     Physical Exam Vitals reviewed.  Constitutional:      Appearance: Normal appearance.  Skin:    General: Skin is warm and dry.  Neurological:     General: No focal deficit present.     Mental Status: He is alert and oriented to person, place, and time.  Psychiatric:        Mood and Affect: Mood normal.        Behavior: Behavior normal.      UC Treatments / Results  Labs (all labs ordered are listed, but only abnormal results are displayed) Labs Reviewed - No data to display  EKG   Radiology No results found.  Procedures Procedures (including critical care time)  Medications Ordered in UC Medications - No data to display  Initial Impression / Assessment and Plan / UC Course  I have reviewed the triage vital signs and the nursing notes.  Pertinent labs & imaging results that were available during my care of the patient were reviewed by me and considered in my medical decision making (see chart for  details).   Evaluation of the patient per protocol and at the request of nurse who was tasked with removing the sutures. She was concerned regarding the integrity of the sutured skin. Center of 7 suture fell out at home. The wound remains well approximated and intact following removal of all remaining sutures.  There is some erythema surrounding the wound but no warmth or swelling.  He is completing a course of cephalexin.  There is no discharge from the wound.  Applied Steri-Strips to provide additional stability to the wound and asked him to try to leave them in place for a few more days.   Charma Igo, [facility] 01/16/22 1139  Final Clinical Impressions(s) / UC Diagnoses   Final diagnoses:  None   Discharge Instructions   None    ED Prescriptions   None    PDMP not reviewed this encounter.   Charma Igo, FNP 01/22/22 0751    Charma Igo, FNP 01/22/22 [phone]

## 2022-04-10 ENCOUNTER — Encounter: Payer: Self-pay | Admitting: Cardiology

## 2022-04-10 ENCOUNTER — Ambulatory Visit: Payer: Medicare Other | Admitting: Cardiology

## 2022-04-10 VITALS — BP 132/68 | HR 66 | Resp 16 | Ht 73.0 in | Wt 180.0 lb

## 2022-04-10 DIAGNOSIS — E781 Pure hyperglyceridemia: Secondary | ICD-10-CM

## 2022-04-10 DIAGNOSIS — R072 Precordial pain: Secondary | ICD-10-CM

## 2022-04-10 DIAGNOSIS — K224 Dyskinesia of esophagus: Secondary | ICD-10-CM

## 2022-04-10 DIAGNOSIS — R0609 Other forms of dyspnea: Secondary | ICD-10-CM

## 2022-04-10 MED ORDER — NITROGLYCERIN 0.4 MG SL SUBL: 0.4000 mg | SUBLINGUAL_TABLET | SUBLINGUAL | 0 refills | Status: DC | PRN

## 2022-04-10 MED ORDER — METOPROLOL TARTRATE 25 MG PO TABS: 25.0000 mg | ORAL_TABLET | Freq: Two times a day (BID) | ORAL | 0 refills | Status: DC

## 2022-04-10 MED ORDER — ASPIRIN 81 MG PO TBEC: 81.0000 mg | DELAYED_RELEASE_TABLET | Freq: Every day | ORAL | 12 refills | Status: DC

## 2022-04-10 NOTE — Progress Notes (Signed)
ID:  HUSAYN REIM, DOB 12/10/1940, MRN [id number]  PCP:  Collene Leyden, MD  Cardiologist:  Rex Kras, DO, [facility] (established care April 10, 2022)  REASON FOR CONSULT: Right bundle branch block and chest pain  REQUESTING PHYSICIAN:  Gaynelle Arabian, MD [address] [place],  [place] [address]  Chief Complaint  Patient presents with   Chest Pain   New Patient (Initial Visit)    HPI  Mark Booker is a 82 y.o. Caucasian male who presents to the clinic for evaluation of right bundle branch block and chest pain at the request of Gaynelle Arabian, MD. His past medical history and cardiovascular risk factors include: Hypertriglyceridemia, right bundle branch block, hemochromatosis, erectile dysfunction, Peyronie's disease, esophageal spasms  Patient presents to the office for evaluation of chest pain and shortness of breath has been ongoing for the last 2-4 years.  Overall intensity frequency and duration has not changed but still concerned.  The precordial pain is across his anterior chest, intensity is 2 out of 10, not brought on by regular activity such as walking, but with overexertion (digging, lawnmowing) the discomfort is noticeable, the symptoms do improve with resting.  At times the discomfort radiates to the back but not always.  No episodes of near syncope, syncope, focal neurological deficits.  FUNCTIONAL STATUS: During the winter months he exercises with his wife Monday Wednesday Friday for 1.5 hours.  During the summer months he does weed eating and lawnmowing (self-propelled mower).  ALLERGIES: Allergies  Allergen Reactions   Amoxicillin-Pot Clavulanate     Other reaction(s): GI   Azelastine Hcl     Other reaction(s): "makes food tastes like medicine"   Dust Mite Extract     Other reaction(s): Nasal congestion  Other Reaction(s): Nasal congestion   Molds & Smuts     Other reaction(s): Nasal congestion  Other Reaction(s): Nasal congestion    Tizanidine Hcl     Other reaction(s): sore throat (11/2017)    MEDICATION LIST PRIOR TO VISIT: Current Meds  Medication Sig   aspirin EC 81 MG tablet Take 1 tablet (81 mg total) by mouth daily. Swallow whole.   Cholecalciferol (VITAMIN D3) 50 MCG (2000 UT) TABS Take 1 tablet by mouth daily at 6 (six) AM.   cyanocobalamin (VITAMIN B12) 1000 MCG tablet Take 1,000 mcg by mouth daily.   docusate sodium (STOOL SOFTENER) 100 MG capsule Take 100 mg by mouth daily.   fish oil-omega-3 fatty acids 1000 MG capsule Take 1 g by mouth daily.   gabapentin (NEURONTIN) 100 MG capsule Take 100 mg by mouth daily.   glucosamine-chondroitin 500-400 MG tablet Take 1 tablet by mouth daily.   latanoprost (XALATAN) 0.005 % ophthalmic solution 1 drop at bedtime.   metoprolol tartrate (LOPRESSOR) 25 MG tablet Take 1 tablet (25 mg total) by mouth 2 (two) times daily for 10 days. Start 2 days prior to CT scan.   nitroGLYCERIN (NITROSTAT) 0.4 MG SL tablet Place 1 tablet (0.4 mg total) under the tongue every 5 (five) minutes as needed for chest pain. If you require more than two tablets five minutes apart go to the nearest ER via EMS.   Olopatadine-Mometasone (RYALTRIS) [id number] MCG/ACT SUSP Place 1-2 sprays into the nose in the morning and at bedtime.   omeprazole (PRILOSEC) 20 MG capsule Take by mouth.   Propylene Glycol (SYSTANE COMPLETE) 0.6 % SOLN Apply to eye.   psyllium (METAMUCIL) 58.6 % packet Take 1 packet by mouth daily.  Turmeric 500 MG TABS Take 1,000 mg by mouth daily at 6 (six) AM.     PAST MEDICAL HISTORY: Past Medical History:  Diagnosis Date   Hemochromatosis    Hypertriglyceridemia, essential    RBBB     PAST SURGICAL HISTORY: Past Surgical History:  Procedure Laterality Date   BACK SURGERY      FAMILY HISTORY: No family history of premature coronary disease or sudden cardiac death.  SOCIAL HISTORY:  The patient  reports that he has never smoked. He has never used smokeless tobacco. He  reports that he does not currently use alcohol. He reports that he does not use drugs.  REVIEW OF SYSTEMS: Review of Systems  Cardiovascular:  Positive for chest pain and dyspnea on exertion. Negative for claudication, irregular heartbeat, leg swelling, near-syncope, orthopnea, palpitations, paroxysmal nocturnal dyspnea and syncope.  Respiratory:  Negative for shortness of breath.   Hematologic/Lymphatic: Negative for bleeding problem.  Musculoskeletal:  Negative for muscle cramps and myalgias.  Neurological:  Negative for dizziness and light-headedness.    PHYSICAL EXAM:    04/10/2022    8:47 AM 01/16/2022   11:24 AM 01/09/2022    6:05 PM  Vitals with BMI  Height 6\' 1"     Weight 180 lbs    BMI 23.75    Systolic 132 123  Diastolic 68 87 68  Pulse 66 86     Physical Exam  Constitutional: No distress.  Age appropriate, hemodynamically stable.   Neck: No JVD present.  Cardiovascular: Normal rate, regular rhythm, S1 normal, S2 normal, intact distal pulses and normal pulses. Exam reveals no gallop, no S3 and no S4.  No murmur heard. Pulmonary/Chest: Effort normal and breath sounds normal. No stridor. He has no wheezes. He has no rales.  Abdominal: Soft. Bowel sounds are normal. He exhibits no distension. There is no abdominal tenderness.  Musculoskeletal:        General: No edema.     Cervical back: Neck supple.  Neurological: He is alert and oriented to person, place, and time. He has intact cranial nerves (2-12).  Skin: Skin is warm and moist.   CARDIAC DATABASE: EKG: 04/10/2022: Sinus rhythm, 62 bpm, first-degree AV block, right bundle branch block, without underlying injury pattern.  Echocardiogram: No results found for this or any previous visit from the past 1095 days.    Stress Testing: No results found for this or any previous visit from the past 1095 days.   Heart Catheterization: None  LABORATORY DATA: External Labs: Collected: 04/03/2022. TSH 2.04 BUN  15, creatinine 1.13. Sodium 140, potassium 4.9, chloride 105, bicarb 31. AST 14, ALT 10, alkaline phosphatase 90 Total cholesterol 129, triglycerides 180, HDL 40, LDL 59, non-HDL 89. Hemoglobin 13.4, hematocrit 40.8  IMPRESSION:    ICD-10-CM   1. Precordial pain  R07.2 EKG 12-Lead    CT CORONARY MORPH W/CTA COR W/SCORE W/CA W/CM &/OR WO/CM    metoprolol tartrate (LOPRESSOR) 25 MG tablet    aspirin EC 81 MG tablet    nitroGLYCERIN (NITROSTAT) 0.4 MG SL tablet    2. Dyspnea on exertion  R06.09 PCV ECHOCARDIOGRAM COMPLETE    Pro b natriuretic peptide (BNP)    Basic metabolic panel    Magnesium    3. Hypertriglyceridemia  E78.1     4. Esophageal spasm  K22.4        RECOMMENDATIONS: Mark Booker is a 82 y.o. Caucasian male whose past medical history and cardiac risk factors include: Hypertriglyceridemia, right bundle branch  block, hemochromatosis, erectile dysfunction, Peyronie's disease, esophageal spasms.  Precordial pain Symptoms are concerning for possible angina pectoris. No active symptoms. EKG: Nonischemic. Echo will be ordered to evaluate for structural heart disease and left ventricular systolic function. Coronary CTA to evaluate for CAC and obstructive CAD. Recommended to take his Lopressor 25 mg p.o. twice daily 2 days prior to his CT scan. Blood work was recently performed at PCPs office on 04/03/2022 results independently reviewed, noted above. Start aspirin 81 mg p.o. daily until the workup is complete. Sublingual nitroglycerin tablet to use on a as needed basis for chest pain.  Patient is not on erectile dysfunction medication and understands drug to drug interactions. Patient is advised to do his activities of daily living and avoid activities of overexertion until the workup is complete Educated on seeking medical attention sooner by going to the closest ER via EMS if the symptoms increase in intensity, frequency, duration, or has typical chest pain as discussed  in the office.  Patient verbalized understanding.  Dyspnea on exertion Euvolemic on physical examination.. Will proceed with echo as discussed above. I have asked him to keep a log of his blood pressures to see if there is a correlation.  Hypertriglyceridemia Most recent triglyceride levels 180 mg/dL. Currently managed by primary care provider.  Esophageal spasm May also be contributing to his symptoms. Defer management to PCP. May consider a trial of amlodipine or isosorbide if blood pressure allows.  FINAL MEDICATION LIST END OF ENCOUNTER: Meds ordered this encounter  Medications   metoprolol tartrate (LOPRESSOR) 25 MG tablet    Sig: Take 1 tablet (25 mg total) by mouth 2 (two) times daily for 10 days. Start 2 days prior to CT scan.    Dispense:  20 tablet    Refill:  0   aspirin EC 81 MG tablet    Sig: Take 1 tablet (81 mg total) by mouth daily. Swallow whole.    Dispense:  30 tablet    Refill:  12   nitroGLYCERIN (NITROSTAT) 0.4 MG SL tablet    Sig: Place 1 tablet (0.4 mg total) under the tongue every 5 (five) minutes as needed for chest pain. If you require more than two tablets five minutes apart go to the nearest ER via EMS.    Dispense:  30 tablet    Refill:  0    Medications Discontinued During This Encounter  Medication Reason   cetirizine (ZYRTEC) 10 MG tablet Patient Preference   cephALEXin (KEFLEX) 500 MG capsule Completed Course     Current Outpatient Medications:    aspirin EC 81 MG tablet, Take 1 tablet (81 mg total) by mouth daily. Swallow whole., Disp: 30 tablet, Rfl: 12   Cholecalciferol (VITAMIN D3) 50 MCG (2000 UT) TABS, Take 1 tablet by mouth daily at 6 (six) AM., Disp: , Rfl:    cyanocobalamin (VITAMIN B12) 1000 MCG tablet, Take 1,000 mcg by mouth daily., Disp: , Rfl:    docusate sodium (STOOL SOFTENER) 100 MG capsule, Take 100 mg by mouth daily., Disp: , Rfl:    fish oil-omega-3 fatty acids 1000 MG capsule, Take 1 g by mouth daily., Disp: , Rfl:     gabapentin (NEURONTIN) 100 MG capsule, Take 100 mg by mouth daily., Disp: , Rfl:    glucosamine-chondroitin 500-400 MG tablet, Take 1 tablet by mouth daily., Disp: , Rfl:    latanoprost (XALATAN) 0.005 % ophthalmic solution, 1 drop at bedtime., Disp: , Rfl:    metoprolol tartrate (LOPRESSOR) 25 MG  tablet, Take 1 tablet (25 mg total) by mouth 2 (two) times daily for 10 days. Start 2 days prior to CT scan., Disp: 20 tablet, Rfl: 0   nitroGLYCERIN (NITROSTAT) 0.4 MG SL tablet, Place 1 tablet (0.4 mg total) under the tongue every 5 (five) minutes as needed for chest pain. If you require more than two tablets five minutes apart go to the nearest ER via EMS., Disp: 30 tablet, Rfl: 0   Olopatadine-Mometasone (RYALTRIS) 665-25 MCG/ACT SUSP, Place 1-2 sprays into the nose in the morning and at bedtime., Disp: 29 g, Rfl: 5   omeprazole (PRILOSEC) 20 MG capsule, Take by mouth., Disp: , Rfl:    Propylene Glycol (SYSTANE COMPLETE) 0.6 % SOLN, Apply to eye., Disp: , Rfl:    psyllium (METAMUCIL) 58.6 % packet, Take 1 packet by mouth daily., Disp: , Rfl:    Turmeric 500 MG TABS, Take 1,000 mg by mouth daily at 6 (six) AM., Disp: , Rfl:   Orders Placed This Encounter  Procedures   CT CORONARY MORPH W/CTA COR W/SCORE W/CA W/CM &/OR WO/CM   Pro b natriuretic peptide (BNP)   Basic metabolic panel   Magnesium   EKG 12-Lead   PCV ECHOCARDIOGRAM COMPLETE    There are no Patient Instructions on file for this visit.   --Continue cardiac medications as reconciled in final medication list. --Return in about 4 weeks (around 05/08/2022) for Chest pain, Dyspnea, Review test results. or sooner if needed. --Continue follow-up with your primary care physician regarding the management of your other chronic comorbid conditions.  Patient's questions and concerns were addressed to his satisfaction. He voices understanding of the instructions provided during this encounter.   This note was created using a voice recognition  software as a result there may be grammatical errors inadvertently enclosed that do not reflect the nature of this encounter. Every attempt is made to correct such errors.  Rex Kras, [place], [facility]  Pager: [phone] Office: [phone]

## 2022-04-11 ENCOUNTER — Ambulatory Visit: Payer: Medicare Other

## 2022-04-11 ENCOUNTER — Encounter: Payer: Self-pay | Admitting: Cardiology

## 2022-04-11 DIAGNOSIS — R0609 Other forms of dyspnea: Secondary | ICD-10-CM

## 2022-04-13 ENCOUNTER — Other Ambulatory Visit: Payer: Self-pay | Admitting: Family Medicine

## 2022-04-13 ENCOUNTER — Ambulatory Visit
Admission: RE | Admit: 2022-04-13 | Discharge: 2022-04-13 | Disposition: A | Discharge status: Home / Self Care | Payer: Medicare Other | Source: Ambulatory Visit | Attending: Family Medicine | Admitting: Family Medicine

## 2022-04-13 DIAGNOSIS — M792 Neuralgia and neuritis, unspecified: Secondary | ICD-10-CM

## 2022-04-19 NOTE — Progress Notes (Signed)
Gave patient results. Tried to get follow up rescheduled, but patient said he will no longer be coming to this office and will be finding a new provider. He didn't want to elaborate, but I did let him know to let us know where his records will need to be sent when he finds a provider.

## 2022-05-15 ENCOUNTER — Ambulatory Visit: Payer: Medicare Other | Admitting: Cardiology

## 2022-05-16 ENCOUNTER — Ambulatory Visit: Payer: Medicare Other | Admitting: Cardiology

## 2022-07-26 ENCOUNTER — Ambulatory Visit
Admission: RE | Admit: 2022-07-26 | Discharge: 2022-07-26 | Disposition: A | Discharge status: Home / Self Care | Payer: Medicare Other | Source: Ambulatory Visit | Attending: Unknown Physician Specialty | Admitting: Unknown Physician Specialty

## 2022-07-26 ENCOUNTER — Other Ambulatory Visit: Payer: Self-pay | Admitting: Unknown Physician Specialty

## 2022-07-26 DIAGNOSIS — G501 Atypical facial pain: Secondary | ICD-10-CM

## 2022-08-20 NOTE — Progress Notes (Signed)
Cardiology Office Note:   Date:  08/20/2022  NAME:  Mark Booker    MRN: [id number] DOB:  [birth year]   PCP:  Mark Coe, MD  Cardiologist:  None  Electrophysiologist:  None   Referring MD: Mark Coe, MD   No chief complaint on file. ***  History of Present Illness:   Mark Booker is a 82 y.o. male with a hx of hemochromatosis, HLD, RBBB who is being seen today for the evaluation of palpitations at the request of Mark Coe, MD.  Problem List Hemochromatosis  RBBB HLD  Past Medical History: Past Medical History:  Diagnosis Date   Hemochromatosis    Hypertriglyceridemia, essential    RBBB     Past Surgical History: Past Surgical History:  Procedure Laterality Date   BACK SURGERY      Current Medications: No outpatient medications have been marked as taking for the 08/21/22 encounter (Appointment) with Mark Rives, MD.     Allergies:    Amoxicillin-pot clavulanate, Azelastine hcl, Dust mite extract, Molds & smuts, and Tizanidine hcl   Social History: Social History   Socioeconomic History   Marital status: Married    Spouse name: Not on file   Number of children: 0   Years of education: Not on file   Highest education level: Not on file  Occupational History   Not on file  Tobacco Use   Smoking status: Never   Smokeless tobacco: Never  Vaping Use   Vaping Use: Never used  Substance and Sexual Activity   Alcohol use: Not Currently   Drug use: Never   Sexual activity: Not on file  Other Topics Concern   Not on file  Social History Narrative   Not on file   Social Determinants of Health   Financial Resource Strain: Not on file  Food Insecurity: Not on file  Transportation Needs: Not on file  Physical Activity: Not on file  Stress: Not on file  Social Connections: Not on file     Family History: The patient's ***family history is not on file.  ROS:   All other ROS reviewed and negative. Pertinent positives noted in the HPI.      EKGs/Labs/Other Studies Reviewed:   The following studies were personally reviewed by me today:  EKG:  EKG is *** ordered today.  The ekg ordered today demonstrates ***, and was personally reviewed by me.   TTE 04/11/2022 Echocardiogram 04/11/2022: Normal LV systolic function with visual EF 60-65%. Left ventricle cavity is normal in size. Normal left ventricular wall thickness. Normal global wall motion. Normal diastolic filling pattern, normal LAP. Mild tricuspid regurgitation. No evidence of pulmonary hypertension. Mild pulmonic regurgitation. No prior study for comparison.    Recent Labs: 09/19/2021: Hemoglobin 13.3; Platelets 268   Recent Lipid Panel No results found for: "CHOL", "TRIG", "HDL", "CHOLHDL", "VLDL", "LDLCALC", "LDLDIRECT"  Physical Exam:   VS:  There were no vitals taken for this visit.   Wt Readings from Last 3 Encounters:  04/10/22 180 lb (81.6 kg)  01/09/22 175 lb (79.4 kg)  09/19/21 181 lb 6 oz (82.3 kg)    General: Well nourished, well developed, in no acute distress Head: Atraumatic, normal size  Eyes: PEERLA, EOMI  Neck: Supple, no JVD Endocrine: No thryomegaly Cardiac: Normal S1, S2; RRR; no murmurs, rubs, or gallops Lungs: Clear to auscultation bilaterally, no wheezing, rhonchi or rales  Abd: Soft, nontender, no hepatomegaly  Ext: No edema, pulses 2+ Musculoskeletal: No deformities, BUE and BLE  strength normal and equal Skin: Warm and dry, no rashes   Neuro: Alert and oriented to person, place, time, and situation, CNII-XII grossly intact, no focal deficits  Psych: Normal mood and affect   ASSESSMENT:   [name] is a 82 y.o. male who presents for the following: No diagnosis found.  PLAN:   There are no diagnoses linked to this encounter.  {Are you ordering a CV Procedure (e.g. stress test, cath, DCCV, TEE, etc)?   Press F2        :[id number]}  Disposition: No follow-ups on file.  Medication Adjustments/Labs and Tests  Ordered: Current medicines are reviewed at length with the patient today.  Concerns regarding medicines are outlined above.  No orders of the defined types were placed in this encounter.  No orders of the defined types were placed in this encounter.   There are no Patient Instructions on file for this visit.   Time Spent with Patient: I have spent a total of *** minutes with patient reviewing hospital notes, telemetry, EKGs, labs and examining the patient as well as establishing an assessment and plan that was discussed with the patient.  > 50% of time was spent in direct patient care.  Signed, Lenna Gilford. Flora Lipps, MD, [facility]  [facility]  [address], [address] [place], [place] [address] [phone]  08/20/2022 7:35 PM

## 2022-08-21 ENCOUNTER — Ambulatory Visit: Payer: Medicare Other | Attending: Cardiovascular Disease | Admitting: Cardiovascular Disease

## 2022-08-21 ENCOUNTER — Ambulatory Visit: Payer: Medicare Other | Attending: Cardiovascular Disease

## 2022-08-21 ENCOUNTER — Encounter: Payer: Self-pay | Admitting: Cardiovascular Disease

## 2022-08-21 VITALS — BP 114/62 | HR 62 | Ht 73.0 in | Wt 179.0 lb

## 2022-08-21 DIAGNOSIS — R0602 Shortness of breath: Secondary | ICD-10-CM | POA: Diagnosis present

## 2022-08-21 DIAGNOSIS — E782 Mixed hyperlipidemia: Secondary | ICD-10-CM | POA: Insufficient documentation

## 2022-08-21 DIAGNOSIS — R002 Palpitations: Secondary | ICD-10-CM | POA: Insufficient documentation

## 2022-08-21 DIAGNOSIS — R072 Precordial pain: Secondary | ICD-10-CM | POA: Insufficient documentation

## 2022-08-21 DIAGNOSIS — I4510 Unspecified right bundle-branch block: Secondary | ICD-10-CM | POA: Diagnosis present

## 2022-08-21 NOTE — Patient Instructions (Addendum)
Medication Instructions:  The current medical regimen is effective;  continue present plan and medications.  *If you need a refill on your cardiac medications before your next appointment, please call your pharmacy*   Lab Work: BMET, BNP today   If you have labs (blood work) drawn today and your tests are completely normal, you will receive your results only by: MyChart Message (if you have MyChart) OR A paper copy in the mail If you have any lab test that is abnormal or we need to change your treatment, we will call you to review the results.   Testing/Procedures: Coronary CTA- they will call you to get this scheduled.     ZIO XT- Long Term Monitor Instructions  Your physician has requested you wear a ZIO patch monitor for 7 days.  This is a single patch monitor. [employer] supplies one patch monitor per enrollment. Additional stickers are not available. Please do not apply patch if you will be having a Nuclear Stress Test,  Echocardiogram, Cardiac CT, MRI, or Chest Xray during the period you would be wearing the  monitor. The patch cannot be worn during these tests. You cannot remove and re-apply the  ZIO XT patch monitor.  Your ZIO patch monitor will be mailed 3 day USPS to your address on file. It may take 3-5 days  to receive your monitor after you have been enrolled.  Once you have received your monitor, please review the enclosed instructions. Your monitor  has already been registered assigning a specific monitor serial # to you.  Billing and Patient Assistance Program Information  We have supplied [employer] with any of your insurance information on file for billing purposes. [employer] offers a sliding scale Patient Assistance Program for patients that do not have  insurance, or whose insurance does not completely cover the cost of the ZIO monitor.  You must apply for the Patient Assistance Program to qualify for this discounted rate.  To apply, please call [employer] at  [phone], select option 4, select option 2, ask to apply for  Patient Assistance Program. Meredeth Ide will ask your household income, and how many people  are in your household. They will quote your out-of-pocket cost based on that information.  [employer] will also be able to set up a 43-month, interest-free payment plan if needed.  Applying the monitor   Shave hair from upper left chest.  Hold abrader disc by orange tab. Rub abrader in 40 strokes over the upper left chest as  indicated in your monitor instructions.  Clean area with 4 enclosed alcohol pads. Let dry.  Apply patch as indicated in monitor instructions. Patch will be placed under collarbone on left  side of chest with arrow pointing upward.  Rub patch adhesive wings for 2 minutes. Remove white label marked "1". Remove the white  label marked "2". Rub patch adhesive wings for 2 additional minutes.  While looking in a mirror, press and release button in center of patch. A small green light will  flash 3-4 times. This will be your only indicator that the monitor has been turned on.  Do not shower for the first 24 hours. You may shower after the first 24 hours.  Press the button if you feel a symptom. You will hear a small click. Record Date, Time and  Symptom in the Patient Logbook.  When you are ready to remove the patch, follow instructions on the last 2 pages of Patient  Logbook. Stick patch monitor onto the last page of  Patient Logbook.  Place Patient Logbook in the blue and white box. Use locking tab on box and tape box closed  securely. The blue and white box has prepaid postage on it. Please place it in the mailbox as  soon as possible. Your physician should have your test results approximately 7 days after the  monitor has been mailed back to [facility].  Call [facility] Customer Care at [phone] if you have questions regarding  your ZIO XT patch monitor. Call them immediately if you see an orange light  blinking on your  monitor.  If your monitor falls off in less than 4 days, contact our Monitor department at [phone].  If your monitor becomes loose or falls off after 4 days call [employer] at [phone] for  suggestions on securing your monitor    Follow-Up: At [facility], you and your health needs are our priority.  As part of our continuing mission to provide you with exceptional heart care, we have created designated Provider Care Teams.  These Care Teams include your primary Cardiologist (physician) and Advanced Practice Providers (APPs -  Physician Assistants and Nurse Practitioners) who all work together to provide you with the care you need, when you need it.  We recommend signing up for the patient portal called "MyChart".  Sign up information is provided on this After Visit Summary.  MyChart is used to connect with patients for Virtual Visits (Telemedicine).  Patients are able to view lab/test results, encounter notes, upcoming appointments, etc.  Non-urgent messages can be sent to your provider as well.   To learn more about what you can do with MyChart, go to [email or web address].    Your next appointment:   3 month(s)  Provider:   Lennie Odor, MD   Other Instructions   Your cardiac CT will be scheduled at one of the below locations:   [facility] [address] [place], [place] [address] [phone]   If scheduled at [facility], please arrive at the [facility] (Entrance C2) of [facility] 30 minutes prior to test start time. You can use the FREE valet parking offered at entrance C (encouraged to control the heart rate for the test)  Proceed to the [facility] Radiology Department (first floor) to check-in and test prep.  All radiology patients and guests should use entrance C2 at [facility], accessed from [facility], even though the hospital's physical address listed is [address].     Please follow these instructions carefully (unless otherwise directed):  Hold all erectile dysfunction medications at least 3 days (72 hrs) prior to test. (Ie viagra, cialis, sildenafil, tadalafil, etc) We will administer nitroglycerin during this exam.   On the Night Before the Test: Be sure to Drink plenty of water. Do not consume any caffeinated/decaffeinated beverages or chocolate 12 hours prior to your test. Do not take any antihistamines 12 hours prior to your test.  On the Day of the Test: Drink plenty of water until 1 hour prior to the test. Do not eat any food 1 hour prior to test. You may take your regular medications prior to the test.  If you take Furosemide/Hydrochlorothiazide/Spironolactone, please HOLD on the morning of the test.  After the Test: Drink plenty of water. After receiving IV contrast, you may experience a mild flushed feeling. This is normal. On occasion, you may experience a mild rash up to 24 hours after the test. This is  not dangerous. If this occurs, you can take Benadryl 25 mg and increase your fluid intake. If you experience trouble breathing, this can be serious. If it is severe call 911 IMMEDIATELY. If it is mild, please call our office. If you take any of these medications: Glipizide/Metformin, Avandament, Glucavance, please do not take 48 hours after completing test unless otherwise instructed.  We will call to schedule your test 2-4 weeks out understanding that some insurance companies will need an authorization prior to the service being performed.   For non-scheduling related questions, please contact the cardiac imaging nurse navigator should you have any questions/concerns: Rockwell Alexandria, Cardiac Imaging Nurse Navigator Larey Brick, Cardiac Imaging Nurse Navigator [facility] Direct Office Dial: [phone]   For scheduling needs, including cancellations and rescheduling, please call  [place], [phone].

## 2022-08-21 NOTE — Progress Notes (Unsigned)
Enrolled for [employer] to mail a ZIO XT long term holter monitor to the patients address on file.  

## 2022-08-22 LAB — BASIC METABOLIC PANEL
BUN/Creatinine Ratio: 13 (ref 10–24)
BUN: 13 mg/dL (ref 8–27)
CO2: 23 mmol/L (ref 20–29)
Calcium: 9.9 mg/dL (ref 8.6–10.2)
Chloride: 103 mmol/L (ref 96–106)
Creatinine, Ser: 1.03 mg/dL (ref 0.76–1.27)
Glucose: 92 mg/dL (ref 70–99)
Potassium: 5.3 mmol/L — ABNORMAL HIGH (ref 3.5–5.2)
Sodium: 142 mmol/L (ref 134–144)
eGFR: 73 mL/min/{1.73_m2} (ref >59)

## 2022-08-22 LAB — BRAIN NATRIURETIC PEPTIDE: BNP: 27.1 pg/mL (ref 0.0–100.0)

## 2022-08-24 DIAGNOSIS — R002 Palpitations: Secondary | ICD-10-CM | POA: Diagnosis not present

## 2022-09-04 ENCOUNTER — Telehealth (HOSPITAL_COMMUNITY): Payer: Self-pay | Admitting: *Deleted

## 2022-09-04 NOTE — Telephone Encounter (Signed)
Reaching out to patient to offer assistance regarding upcoming cardiac imaging study; pt verbalizes understanding of appt date/time, parking situation and where to check in, pre-test NPO status and medications ordered, and verified current allergies; name and call back number provided for further questions should they arise Sharvi Mooneyhan RN Navigator Cardiac Imaging [facility] [phone] office [phone] cell  

## 2022-09-05 ENCOUNTER — Ambulatory Visit (HOSPITAL_COMMUNITY)
Admission: RE | Admit: 2022-09-05 | Discharge: 2022-09-05 | Disposition: A | Discharge status: Home / Self Care | Payer: Medicare Other | Source: Ambulatory Visit | Attending: Cardiovascular Disease | Admitting: Cardiovascular Disease

## 2022-09-05 DIAGNOSIS — R072 Precordial pain: Secondary | ICD-10-CM | POA: Diagnosis present

## 2022-09-05 MED ORDER — NITROGLYCERIN 0.4 MG SL SUBL
SUBLINGUAL_TABLET | SUBLINGUAL | Status: AC
  Filled 2022-09-05: qty 2

## 2022-09-05 MED ORDER — IOHEXOL 350 MG/ML SOLN
95.0000 mL | Freq: Once | INTRAVENOUS | Status: AC | PRN
  Administered 2022-09-05: 95 mL via INTRAVENOUS

## 2022-09-05 MED ORDER — NITROGLYCERIN 0.4 MG SL SUBL
0.8000 mg | SUBLINGUAL_TABLET | Freq: Once | SUBLINGUAL | Status: AC
  Administered 2022-09-05: 0.8 mg via SUBLINGUAL

## 2022-11-21 NOTE — Progress Notes (Signed)
Cardiology Office Note:   Date:  11/24/2022  NAME:  Mark Booker    MRN: [id number] DOB:  08/25/1940   PCP:  Irven Coe, MD  Cardiologist:  None  Electrophysiologist:  None   Referring MD: Irven Coe, MD   Chief Complaint  Patient presents with   Follow-up    History of Present Illness:   Mark Booker is a 82 y.o. male with a hx of RBBB, hemochromatosis, HLD who presents for follow-up.  Discussed the use of AI scribe software for clinical note transcription with the patient, who gave verbal consent to proceed.  History of Present Illness   The patient, with a history of acid reflux, presents with ongoing, but improving, chest pain. He describes the pain as a 'little pressure' that is not constant, but noticeable when at rest. The patient attributes the chest pain to stress, which has been high due to a recent [place] farm [place] a townhome. However, he notes that the stress and associated chest pain have been decreasing as he settles into his new home. He also reports a history of acid reflux, which is currently managed with Prilosec.        Problem List Hemochromatosis  RBBB HLD -T chol 132, HDL 42, LDL 62, TG 167 4. 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 PACs -1.7% burden      Past Medical History: Past Medical History:  Diagnosis Date   Hemochromatosis    Hypertriglyceridemia, essential    RBBB     Past Surgical History: Past Surgical History:  Procedure Laterality Date   BACK SURGERY      Current Medications: Current Meds  Medication Sig   aspirin EC 81 MG tablet Take 1 tablet (81 mg total) by mouth daily. Swallow whole.   Cholecalciferol (VITAMIN D3) 50 MCG (2000 UT) TABS Take 1 tablet by mouth daily at 6 (six) AM.   docusate sodium (STOOL SOFTENER) 100 MG capsule Take 100 mg by mouth daily.   glucosamine-chondroitin 500-400 MG tablet Take 1 tablet by mouth daily.   latanoprost (XALATAN) 0.005 % ophthalmic solution 1 drop at  bedtime.   Olopatadine-Mometasone (RYALTRIS) [id number] MCG/ACT SUSP Place 1-2 sprays into the nose in the morning and at bedtime.   omeprazole (PRILOSEC) 20 MG capsule Take by mouth.   Propylene Glycol (SYSTANE COMPLETE) 0.6 % SOLN Apply to eye.   psyllium (METAMUCIL) 58.6 % packet Take 1 packet by mouth daily.   Turmeric 500 MG TABS Take 1,000 mg by mouth daily at 6 (six) AM.   [DISCONTINUED] fish oil-omega-3 fatty acids 1000 MG capsule Take 1 g by mouth daily.     Allergies:    Amoxicillin-pot clavulanate, Azelastine hcl, Dust mite extract, Molds & smuts, and Tizanidine hcl   Social History: Social History   Socioeconomic History   Marital status: Married    Spouse name: Not on file   Number of children: 0   Years of education: Not on file   Highest education level: Not on file  Occupational History   Occupation: Retired Plumbing/Heating  Tobacco Use   Smoking status: Never   Smokeless tobacco: Never  Vaping Use   Vaping status: Never Used  Substance and Sexual Activity   Alcohol use: Not Currently   Drug use: Never   Sexual activity: Not on file  Other Topics Concern   Not on file  Social History Narrative   Not on file   Social Determinants of Health  Financial Resource Strain: Not on file  Food Insecurity: Not on file  Transportation Needs: Not on file  Physical Activity: Not on file  Stress: Not on file  Social Connections: Not on file     Family History: The patient's family history includes Hypertension in his father.  ROS:   All other ROS reviewed and negative. Pertinent positives noted in the HPI.     EKGs/Labs/Other Studies Reviewed:   The following studies were personally reviewed by me today:  EKG:  EKG is not ordered today.        Recent Labs: 08/21/2022: BNP 27.1; BUN 13; Creatinine, Ser 1.03; Potassium 5.3; Sodium 142   Recent Lipid Panel No results found for: "CHOL", "TRIG", "HDL", "CHOLHDL", "VLDL", "LDLCALC", "LDLDIRECT"  Physical  Exam:   VS:  BP 118/60 (BP Location: Left Arm, Patient Position: Sitting, Cuff Size: Normal)   Pulse 70   Ht 6\' 1"  (1.854 m)   Wt 182 lb (82.6 kg)   BMI 24.01 kg/m    Wt Readings from Last 3 Encounters:  11/24/22 182 lb (82.6 kg)  08/21/22 179 lb (81.2 kg)  04/10/22 180 lb (81.6 kg)    General: Well nourished, well developed, in no acute distress Head: Atraumatic, normal size  Eyes: PEERLA, EOMI  Neck: Supple, no JVD Endocrine: No thryomegaly Cardiac: Normal S1, S2; RRR; no murmurs, rubs, or gallops Lungs: Clear to auscultation bilaterally, no wheezing, rhonchi or rales  Abd: Soft, nontender, no hepatomegaly  Ext: No edema, pulses 2+ Musculoskeletal: No deformities, BUE and BLE strength normal and equal Skin: Warm and dry, no rashes   Neuro: Alert and oriented to person, place, time, and situation, CNII-XII grossly intact, no focal deficits  Psych: Normal mood and affect   ASSESSMENT:   Mark Booker is a 82 y.o. male who presents for the following: 1. Precordial pain   2. Mixed hyperlipidemia   3. Gastroesophageal reflux disease without esophagitis     PLAN:   Assessment and Plan    Chest Pain Mild, intermittent chest pain, possibly related to stress and acid reflux. No concerning findings on recent CT scan. No exertional symptoms. -Consider switching from Prilosec to Nexium for better acid reflux control. -Continue regular exercise and consider stress-reducing activities such as trips to the beach. -Discuss potential stress management strategies with primary care provider, Dr. Lewie Chamber.  Premature Atrial Contractions (PACs) Infrequent PACs noted on monitor, not symptomatic. -No treatment necessary at this time.  Hyperlipidemia Excellent cholesterol control without medication. -No need for fish oil supplementation or cholesterol-lowering medication.  Follow-up Annual cardiology visits.           Disposition: Return if symptoms worsen or fail to  improve.  Medication Adjustments/Labs and Tests Ordered: Current medicines are reviewed at length with the patient today.  Concerns regarding medicines are outlined above.  No orders of the defined types were placed in this encounter.  No orders of the defined types were placed in this encounter.  Patient Instructions  Medication Instructions:  Your physician recommends that you continue on your current medications as directed. Please refer to the Current Medication list given to you today.   *If you need a refill on your cardiac medications before your next appointment, please call your pharmacy*    Follow-Up: At [facility], you and your health needs are our priority.  As part of our continuing mission to provide you with exceptional heart care, we have created designated Provider Care Teams.  These Care Teams include  your primary Cardiologist (physician) and Advanced Practice Providers (APPs -  Physician Assistants and Nurse Practitioners) who all work together to provide you with the care you need, when you need it.     Your next appointment:   1 year(s)  The format for your next appointment:   In Person  Provider:   Edd Fabian, FNP, Robet Leu, PA-C, Juanda Crumble, PA-C, Joni Reining, DNP, ANP, Azalee Course, PA-C, or Bernadene Person, NP         Time Spent with Patient: I have spent a total of 25 minutes with patient reviewing hospital notes, telemetry, EKGs, labs and examining the patient as well as establishing an assessment and plan that was discussed with the patient.  > 50% of time was spent in direct patient care.  Signed, Lenna Gilford. Flora Lipps, MD, [facility]  [facility]  [address], [address] [place], [place] [address] [phone]  11/24/2022 10:08 AM

## 2022-11-24 ENCOUNTER — Encounter: Payer: Self-pay | Admitting: Cardiovascular Disease

## 2022-11-24 ENCOUNTER — Ambulatory Visit: Payer: Medicare Other | Attending: Cardiovascular Disease | Admitting: Cardiovascular Disease

## 2022-11-24 VITALS — BP 118/60 | HR 70 | Ht 73.0 in | Wt 182.0 lb

## 2022-11-24 DIAGNOSIS — E782 Mixed hyperlipidemia: Secondary | ICD-10-CM | POA: Diagnosis not present

## 2022-11-24 DIAGNOSIS — R072 Precordial pain: Secondary | ICD-10-CM | POA: Diagnosis present

## 2022-11-24 DIAGNOSIS — K219 Gastro-esophageal reflux disease without esophagitis: Secondary | ICD-10-CM | POA: Insufficient documentation

## 2022-11-24 NOTE — Patient Instructions (Signed)
Medication Instructions:  Your physician recommends that you continue on your current medications as directed. Please refer to the Current Medication list given to you today.   *If you need a refill on your cardiac medications before your next appointment, please call your pharmacy*    Follow-Up: At [facility], you and your health needs are our priority.  As part of our continuing mission to provide you with exceptional heart care, we have created designated Provider Care Teams.  These Care Teams include your primary Cardiologist (physician) and Advanced Practice Providers (APPs -  Physician Assistants and Nurse Practitioners) who all work together to provide you with the care you need, when you need it.     Your next appointment:   1 year(s)  The format for your next appointment:   In Person  Provider:   Edd Fabian, FNP, Robet Leu, PA-C, Juanda Crumble, PA-C, Joni Reining, DNP, ANP, Azalee Course, PA-C, or Bernadene Person, NP

## 2023-06-14 ENCOUNTER — Inpatient Hospital Stay

## 2023-06-14 ENCOUNTER — Inpatient Hospital Stay: Payer: Medicare Other | Attending: Internal Medicine | Admitting: Internal Medicine

## 2023-06-14 ENCOUNTER — Inpatient Hospital Stay: Payer: Medicare Other

## 2023-06-14 ENCOUNTER — Other Ambulatory Visit: Payer: Self-pay | Admitting: Medical Oncology

## 2023-06-14 DIAGNOSIS — Z79899 Other long term (current) drug therapy: Secondary | ICD-10-CM | POA: Insufficient documentation

## 2023-06-14 DIAGNOSIS — I4510 Unspecified right bundle-branch block: Secondary | ICD-10-CM | POA: Insufficient documentation

## 2023-06-14 DIAGNOSIS — E781 Pure hyperglyceridemia: Secondary | ICD-10-CM | POA: Diagnosis not present

## 2023-06-14 LAB — CBC WITH DIFFERENTIAL (CANCER CENTER ONLY)
Abs Immature Granulocytes: 0.03 10*3/uL (ref 0.00–0.07)
Basophils Absolute: 0.1 10*3/uL (ref 0.0–0.1)
Basophils Relative: 1 %
Eosinophils Absolute: 0.1 10*3/uL (ref 0.0–0.5)
Eosinophils Relative: 2 %
HCT: 41.6 % (ref 39.0–52.0)
Hemoglobin: 13.1 g/dL (ref 13.0–17.0)
Immature Granulocytes: 1 %
Lymphocytes Relative: 30 %
Lymphs Abs: 1.9 10*3/uL (ref 0.7–4.0)
MCH: 27.2 pg (ref 26.0–34.0)
MCHC: 31.5 g/dL (ref 30.0–36.0)
MCV: 86.5 fL (ref 80.0–100.0)
Monocytes Absolute: 0.6 10*3/uL (ref 0.1–1.0)
Monocytes Relative: 8 %
Neutro Abs: 3.8 10*3/uL (ref 1.7–7.7)
Neutrophils Relative %: 58 %
Platelet Count: 236 10*3/uL (ref 150–400)
RBC: 4.81 MIL/uL (ref 4.22–5.81)
RDW: 16.7 % — ABNORMAL HIGH (ref 11.5–15.5)
WBC Count: 6.5 10*3/uL (ref 4.0–10.5)
nRBC: 0 % (ref 0.0–0.2)

## 2023-06-14 LAB — CMP (CANCER CENTER ONLY)
ALT: 11 U/L (ref 0–44)
AST: 19 U/L (ref 15–41)
Albumin: 4.6 g/dL (ref 3.5–5.0)
Alkaline Phosphatase: 107 U/L (ref 38–126)
Anion gap: 6 (ref 5–15)
BUN: 14 mg/dL (ref 8–23)
CO2: 29 mmol/L (ref 22–32)
Calcium: 9.5 mg/dL (ref 8.9–10.3)
Chloride: 104 mmol/L (ref 98–111)
Creatinine: 1.15 mg/dL (ref 0.61–1.24)
GFR, Estimated: >60 mL/min (ref >60)
Glucose, Bld: 98 mg/dL (ref 70–99)
Potassium: 4.4 mmol/L (ref 3.5–5.1)
Sodium: 139 mmol/L (ref 135–145)
Total Bilirubin: 0.4 mg/dL (ref 0.0–1.2)
Total Protein: 7.6 g/dL (ref 6.5–8.1)

## 2023-06-14 LAB — IRON AND IRON BINDING CAPACITY (CC-WL,HP ONLY)
Iron: 79 ug/dL (ref 45–182)
Saturation Ratios: 23 % (ref 17.9–39.5)
TIBC: 337 ug/dL (ref 250–450)
UIBC: 258 ug/dL (ref 117–376)

## 2023-06-14 LAB — FERRITIN: Ferritin: 15 ng/mL — ABNORMAL LOW (ref 24–336)

## 2023-06-14 NOTE — Progress Notes (Signed)
 [name] [phone]) [phone]   [phone]  CONSULT NOTE  REFERRING PHYSICIAN: Dr. Irven Coe  REASON FOR CONSULTATION:  83 years old white male with hemochromatosis  HPI Mark Booker is a 83 y.o. male.   Discussed the use of AI scribe software for clinical note transcription with the patient, who gave verbal consent to proceed.  History of Present Illness   Mark Booker is an 83 year old male with hemochromatosis who presents for follow-up care. He is accompanied by his wife, Mark Booker. He was previously seen by Dr. Clelia Croft for management of hemochromatosis.  He has a history of hemochromatosis diagnosed over 18 years ago following routine blood work and a liver biopsy that revealed iron deposition. He has been managed with regular phlebotomy, initially donating blood at the [facility] until his hemoglobin levels fell below his acceptable range. Approximately a year and a half ago, his hemoglobin was noted to be 11, and he has not donated blood since. His last recorded ferritin level was 42 in 2020, and he is awaiting current lab results to assess his ferritin and iron levels.  He frequently feels tired, although his current hemoglobin is 13.1, which is within normal limits. He exercises three times a week. No chest pain, shortness of breath, nausea, vomiting, or bleeding.  He experiences headaches that come and go, which he attributes to neck muscle issues. No further details on frequency or severity are provided.  Socially, he is a retired Nutritional therapist, does not smoke, drink alcohol, or use street drugs. He has no known drug allergies.       HPI  Past Medical History:  Diagnosis Date   Hemochromatosis    Hypertriglyceridemia, essential    RBBB     Past Surgical History:  Procedure Laterality Date   BACK SURGERY      Family History  Problem Relation Age of Onset   Hypertension Father     Social History Social History   Tobacco Use    Smoking status: Never   Smokeless tobacco: Never  Vaping Use   Vaping status: Never Used  Substance Use Topics   Alcohol use: Not Currently   Drug use: Never    Allergies  Allergen Reactions   Amoxicillin-Pot Clavulanate     Other reaction(s): GI   Azelastine Hcl     Other reaction(s): "makes food tastes like medicine"   Dust Mite Extract     Other reaction(s): Nasal congestion  Other Reaction(s): Nasal congestion   Molds & Smuts     Other reaction(s): Nasal congestion  Other Reaction(s): Nasal congestion   Tizanidine Hcl     Other reaction(s): sore throat (11/2017)    Current Outpatient Medications  Medication Sig Dispense Refill   aspirin EC 81 MG tablet Take 1 tablet (81 mg total) by mouth daily. Swallow whole. 30 tablet 12   Cholecalciferol (VITAMIN D3) 50 MCG (2000 UT) TABS Take 1 tablet by mouth daily at 6 (six) AM.     docusate sodium (STOOL SOFTENER) 100 MG capsule Take 100 mg by mouth daily.     glucosamine-chondroitin 500-400 MG tablet Take 1 tablet by mouth daily.     latanoprost (XALATAN) 0.005 % ophthalmic solution 1 drop at bedtime.     metoprolol tartrate (LOPRESSOR) 25 MG tablet Take 1 tablet (25 mg total) by mouth 2 (two) times daily for 10 days. Start 2 days prior to CT scan. 20 tablet 0   nitroGLYCERIN (NITROSTAT) 0.4 MG  SL tablet Place 1 tablet (0.4 mg total) under the tongue every 5 (five) minutes as needed for chest pain. If you require more than two tablets five minutes apart go to the nearest ER via EMS. 30 tablet 0   Olopatadine-Mometasone (RYALTRIS) 665-25 MCG/ACT SUSP Place 1-2 sprays into the nose in the morning and at bedtime. 29 g 5   omeprazole (PRILOSEC) 20 MG capsule Take by mouth.     Propylene Glycol (SYSTANE COMPLETE) 0.6 % SOLN Apply to eye.     psyllium (METAMUCIL) 58.6 % packet Take 1 packet by mouth daily.     Turmeric 500 MG TABS Take 1,000 mg by mouth daily at 6 (six) AM.     No current facility-administered medications for this  visit.    Review of Systems  Constitutional: positive for fatigue Eyes: negative Ears, nose, mouth, throat, and face: negative Respiratory: negative Cardiovascular: negative Gastrointestinal: negative Genitourinary:negative Integument/breast: negative Hematologic/lymphatic: negative Musculoskeletal:negative Neurological: negative Behavioral/Psych: negative Endocrine: negative Allergic/Immunologic: negative  Physical Exam  WUJ:WJXBJ, healthy, no distress, well nourished, and well developed SKIN: skin color, texture, turgor are normal, no rashes or significant lesions HEAD: Normocephalic, No masses, lesions, tenderness or abnormalities EYES: normal, PERRLA, Conjunctiva are pink and non-injected EARS: External ears normal, Canals clear OROPHARYNX:no exudate, no erythema, and lips, buccal mucosa, and tongue normal  NECK: supple, no adenopathy, no JVD LYMPH:  no palpable lymphadenopathy, no hepatosplenomegaly LUNGS: clear to auscultation , and palpation HEART: regular rate & rhythm, no murmurs, and no gallops ABDOMEN:abdomen soft, non-tender, normal bowel sounds, and no masses or organomegaly BACK: Back symmetric, no curvature., No CVA tenderness EXTREMITIES:no joint deformities, effusion, or inflammation, no edema  NEURO: alert & oriented x 3 with fluent speech, no focal motor/sensory deficits  PERFORMANCE STATUS: ECOG 0  LABORATORY DATA: Lab Results  Component Value Date   WBC 6.5 06/14/2023   HGB 13.1 06/14/2023   HCT 41.6 06/14/2023   MCV 86.5 06/14/2023   PLT 236 06/14/2023      Chemistry      Component Value Date/Time   NA 142 08/21/2022 1242   NA 139 08/17/2016 0851   K 5.3 (H) 08/21/2022 1242   K 4.4 08/17/2016 0851   CL 103 08/21/2022 1242   CO2 23 08/21/2022 1242   CO2 24 08/17/2016 0851   BUN 13 08/21/2022 1242   BUN 10.9 08/17/2016 0851   CREATININE 1.03 08/21/2022 1242   CREATININE 1.2 08/17/2016 0851      Component Value Date/Time   CALCIUM  9.9 08/21/2022 1242   CALCIUM 9.6 08/17/2016 0851   ALKPHOS 99 09/04/2017 1245   ALKPHOS 87 08/17/2016 0851   AST 23 09/04/2017 1245   AST 36 (H) 08/17/2016 0851   ALT 19 09/04/2017 1245   ALT 33 08/17/2016 0851   BILITOT 0.7 09/04/2017 1245   BILITOT 0.83 08/17/2016 0851       RADIOGRAPHIC STUDIES: No results found.  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 The specific gene mutation is unknown, necessitating further genetic testing. He has been undergoing therapeutic phlebotomy, initially at [facility] and later at the [facility], although the latter is not recommended due to the risk of transfusing iron-overloaded blood. His hemoglobin is 13.1 g/dL, and the ferritin level is pending. The goal is to maintain ferritin levels below 50 ng/mL to prevent organ damage. He is advised against iron supplementation and consumption of iron-rich foods  due to the risk of exacerbating iron overload. 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 No history of myocardial infarction or cerebrovascular accident.  Hypertriglyceridemia Hypertriglyceridemia noted in medical history. Current triglyceride levels were not discussed.  Follow-up Follow-up is necessary to monitor his condition and adjust treatment as needed based on lab results and clinical evaluation. - Schedule follow-up appointment in 6 months to reassess condition and lab results - Provide contact information for any questions or concerns before the next visit    The patient was advised to call immediately if he has any other concerning symptoms in the interval. The patient voices understanding of current disease status and treatment options and is in agreement with the current care plan.  All questions were answered. The patient knows to call the clinic with any problems, questions or concerns. We can certainly see the patient much sooner if necessary.  Thank you so much for allowing me to participate in the care of Mark Booker. I will continue to follow up the patient with you and assist in his care.  The total time spent in the appointment was 60 minutes.  Disclaimer: This note was dictated with voice recognition software. Similar sounding words can inadvertently be transcribed and may not be corrected upon review.   Lajuana Matte June 14, 2023, 11:53 AM

## 2023-06-19 LAB — HEMOCHROMATOSIS DNA-PCR(C282Y,H63D)

## 2023-08-23 ENCOUNTER — Telehealth: Payer: Self-pay | Admitting: Medical Oncology

## 2023-08-23 NOTE — Telephone Encounter (Signed)
 Pt called and said to cancel his October appointments and that he did not want to reschedule any more appointments.

## 2023-11-29 ENCOUNTER — Encounter: Payer: Self-pay | Admitting: Neurology

## 2023-12-17 ENCOUNTER — Other Ambulatory Visit

## 2023-12-17 ENCOUNTER — Ambulatory Visit: Admitting: Internal Medicine

## 2024-02-20 NOTE — Progress Notes (Unsigned)
 Initial neurology clinic note  COREE BRAME MRN: [id number] DOB: 29-May-1940  Referring provider: Leonel Cole, MD  Primary care provider: Leonel Cole, MD  Reason for consult:  intermittent sharp pains, headaches  Subjective:  This is Mr. Mark Booker, a 83 y.o. ***-handed male with a medical history of hemochromatosis, Peyronie's disease, GERD, psoriasis, glaucoma, cervical spondylosis, former smoker*** who presents to neurology clinic with intermittent sharp pains, headaches. The patient is accompanied by ***.  *** Headache - forehead Present for years 3-4 times/week Lasts 3-4 hours to 2 days Takes excedrin - often it sounds like Thought symptoms were sinus related - ENT eval and sinus xrays were normal Sent by PCP, Dr. Leonel  Smoker: OCP use: Caffiene use: EtOH use: Restrictive diet: Family history of neurologic disease including headaches:   Also has intermittent, migratory stabbing pains or spasms for about 1 year Random Tried gabapentin in the past which helped but caused insomnia so he stopped it  MEDICATIONS:  Outpatient Encounter Medications as of 02/29/2024  Medication Sig   cyanocobalamin (VITAMIN B12) 100 MCG tablet Take 100 mcg by mouth daily. Taking a low dosage He did not know strength   docusate sodium (STOOL SOFTENER) 100 MG capsule Take 100 mg by mouth daily.   glucosamine-chondroitin 500-400 MG tablet Take 1 tablet by mouth daily.   latanoprost (XALATAN) 0.005 % ophthalmic solution 1 drop at bedtime.   omeprazole (PRILOSEC) 20 MG capsule Take by mouth.   Propylene Glycol (SYSTANE COMPLETE) 0.6 % SOLN Apply to eye.   psyllium (METAMUCIL) 58.6 % packet Take 1 packet by mouth daily.   vitamin C (ASCORBIC ACID) 250 MG tablet Take 500 mg by mouth daily.   No facility-administered encounter medications on file as of 02/29/2024.    PAST MEDICAL HISTORY: Past Medical History:  Diagnosis Date   Hemochromatosis    Hypertriglyceridemia,  essential    RBBB     PAST SURGICAL HISTORY: Past Surgical History:  Procedure Laterality Date   BACK SURGERY      ALLERGIES: Allergies  Allergen Reactions   Amoxicillin-Pot Clavulanate     Other reaction(s): GI   Azelastine Hcl     Other reaction(s): makes food tastes like medicine   Dust Mite Extract     Other reaction(s): Nasal congestion  Other Reaction(s): Nasal congestion   Molds & Smuts     Other reaction(s): Nasal congestion  Other Reaction(s): Nasal congestion   Tizanidine Hcl     Other reaction(s): sore throat (11/2017)    FAMILY HISTORY: Family History  Problem Relation Age of Onset   Hypertension Father     SOCIAL HISTORY: Social History   Tobacco Use   Smoking status: Never   Smokeless tobacco: Never  Vaping Use   Vaping status: Never Used  Substance Use Topics   Alcohol use: Not Currently   Drug use: Never   Social History   Social History Narrative   Not on file    Objective:  Vital Signs:  There were no vitals taken for this visit.  ***  Labs and Imaging review: Internal labs: Ferritin (06/14/23): 15  External labs: 05/07/23: CBC significant for Hb 11.9 CMP significant for Cr 1.06 Ferritin 9.6  Imaging/Procedures: CT myelogram of lumbar spine (06/01/2004): IMPRESSION:  1. Right foraminal and extraforaminal disc protrusion at L3-4 not significantly changed.  2. At L4-5 there is left L-5 nerve root encroachment in the lateral recess as described. This correlates with the patient's clinical scenario.   Lumbar  spine xray (09/29/2011): Findings: The lumbar vertebrae remain in normal alignment.  There  is degenerative disc disease which appears relatively stable at the  L5 S1 level where there is some loss of disc space and mild vacuum  disc phenomenon.  There may be mild vacuum disc phenomenon at L4-5  but the intervertebral disc space is maintained.  The remainder of  the intervertebral disc spaces appear normal.  No compression   deformity is seen.  The SI joints appear corticated.   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  Normal  alignment.   Sinus xray (09/05/22): FINDINGS: The paranasal sinus are aerated. There is no evidence of sinus opacification air-fluid levels or mucosal thickening. No significant bone abnormalities are seen.   IMPRESSION: No significant opacification of paranasal sinuses.   ***  Assessment/Plan:  [name] is a 83 y.o. male who presents for evaluation of ***. *** has a relevant medical history of ***. *** neurological examination is pertinent for ***. Available diagnostic data is significant for ***. This constellation of symptoms and objective data would most likely localize to ***. ***  PLAN: -Blood work: *** ***  -Return to clinic ***  The impression above as well as the plan as outlined below were extensively discussed with the patient (in the company of ***) who voiced understanding. All questions were answered to their satisfaction.  The patient was counseled on pertinent fall precautions per the printed material provided today, and as noted under the Patient Instructions section below.***  When available, results of the above investigations and possible further recommendations will be communicated to the patient via telephone/MyChart. Patient to call office if not contacted after expected testing turnaround time.   Total time spent reviewing records, interview, history/exam, documentation, and coordination of care on day of encounter:  *** min   Thank you for allowing me to participate in patient's care.  If I can answer any additional questions, I would be pleased to do so.  Venetia Potters, MD   CC: Leonel Cole, MD [address] [place] [address]  CC: Referring provider: Leonel Cole, MD [address] [place],  [place] [address]

## 2024-02-29 ENCOUNTER — Ambulatory Visit: Admitting: Neurology

## 2024-02-29 ENCOUNTER — Encounter: Payer: Self-pay | Admitting: Neurology

## 2024-02-29 VITALS — BP 117/79 | HR 67 | Ht 73.0 in | Wt 184.0 lb

## 2024-02-29 DIAGNOSIS — G4440 Drug-induced headache, not elsewhere classified, not intractable: Secondary | ICD-10-CM | POA: Diagnosis not present

## 2024-02-29 DIAGNOSIS — G4486 Cervicogenic headache: Secondary | ICD-10-CM | POA: Diagnosis not present

## 2024-02-29 DIAGNOSIS — R202 Paresthesia of skin: Secondary | ICD-10-CM | POA: Diagnosis not present

## 2024-02-29 NOTE — Patient Instructions (Addendum)
 I saw you today for headaches and neck pain.   I will refer you to physical therapy for your neck. This will likely help with your headaches as well.  Limit use of pain relievers to no more than 2 days out of week to prevent risk of rebound or medication-overuse headache.  Keep headache diary  I will see you again in about 6 months. Please let me know if you have any questions or concerns in the meantime.  The physicians and staff at [facility] are committed to providing excellent care. You may receive a survey requesting feedback about your experience at our office. We strive to receive very good responses to the survey questions. If you feel that your experience would prevent you from giving the office a very good  response, please contact our office to try to remedy the situation. We may be reached at [phone]. Thank you for taking the time out of your busy day to complete the survey.  Venetia Potters, MD [facility]    Vitamins and herbs that show potential to help with headaches:  Magnesium: Magnesium (250 mg twice a day or 500 mg at bed) has a relaxant effect on smooth muscles such as blood vessels. Individuals suffering from frequent or daily headache usually have low magnesium levels which can be increase with daily supplementation of 400-750 mg. Three trials found 40-90% average headache reduction when used as a preventative. Magnesium also demonstrated the benefit in menstrually related migraine. Magnesium is part of the messenger system in the serotonin cascade and it is a good muscle relaxant. It is also useful for constipation which can be a side effect of other medications used to treat migraine. Good sources include nuts, whole grains, and tomatoes.  Riboflavin (vitamin B 2) 200 mg twice a day. This vitamin assists nerve cells in the production of ATP a principal energy storing molecule. It is necessary for many chemical reactions in the body. There have been at  least 3 clinical trials of riboflavin using 400 mg per day all of which suggested that migraine frequency can be decreased. All 3 trials showed significant improvement in over half of migraine sufferers. The supplement is found in bread, cereal, milk, meat, and poultry. Most Americans get more riboflavin than the recommended daily allowance, however riboflavin deficiency is not necessary for the supplements to help prevent headache.  Feverfew: Feverfew is a common garden herb native to [place] and popular in [place] as a treatment for disorders typically controlled by aspirin . The mechanism of action is unknown but is believed to be related to a chemical called parthenolide which helps the body use serotonin more effectively. Serotonin helps prevent migraine and assists with resolution when it occurs. Parthenolide also inhibits the release of histamine which is linked to pain and inflammation.  Consistency of active ingredients in different products can be a problem. Some formulations don't have the active ingredient (parthenolide) that prevents migraine. A parthenolide content of 0.2% is generally recommended. Typical dosage is one capsule 3 times a day.  Coenzyme Q10: This is present in almost all cells in the body and is critical component for the conversion of energy. Recent studies have shown that a nutritional supplement of CoQ10 can reduce the frequency of migraine attacks by improving the energy production of cells as with riboflavin. Doses of 150 mg twice a day have been shown to be effective.  Melatonin: Increasing evidence shows correlation between melatonin secretion and headache conditions. Melatonin supplementation has decreased headache  intensity and duration. It is widely used as a sleep aid. Sleep is natures way of dealing with migraine. A dose of 3 mg is recommended to start for headaches including cluster headache. Higher doses up to 15 mg has been reviewed for use in Cluster headache  and have been used. The rationale behind using melatonin for cluster is that many theories regarding the cause of Cluster headache center around the disruption of the normal circadian rhythm in the brain. This helps restore the normal circadian rhythm.  Ginger: Ginger has a small amount of antihistamine and anti-inflammatory action which may help headache. It is primarily used for nausea and may aid in the absorption of other medications.

## 2024-03-12 ENCOUNTER — Other Ambulatory Visit: Payer: Self-pay

## 2024-03-12 ENCOUNTER — Telehealth: Payer: Self-pay | Admitting: Neurology

## 2024-03-12 DIAGNOSIS — G4486 Cervicogenic headache: Secondary | ICD-10-CM

## 2024-03-12 NOTE — Telephone Encounter (Signed)
 Called pt and he reported that he called [facility] in [place] and they told him the one [address] is the one he needs to go too. ( I put the order in for [facility] he called [address] and they he needs to go the the one on [address] . I told him I would call and see what is going on. Called and had to leave a message to call me back.

## 2024-03-12 NOTE — Telephone Encounter (Signed)
 [facility] and they are going to call him.

## 2024-03-12 NOTE — Telephone Encounter (Signed)
 Pt called in this morning. Pt wants to know which Physical Therapy is he suppose to get scheduled with. Pt stated that he reached out to 2 different Physical Therapy office and they both so he needs to go to the other one. Please call.

## 2024-03-12 NOTE — Progress Notes (Signed)
 ref

## 2024-03-20 ENCOUNTER — Ambulatory Visit: Attending: Neurology | Admitting: Physical Therapy

## 2024-03-20 ENCOUNTER — Other Ambulatory Visit: Payer: Self-pay

## 2024-03-20 ENCOUNTER — Encounter: Payer: Self-pay | Admitting: Physical Therapy

## 2024-03-20 DIAGNOSIS — M25511 Pain in right shoulder: Secondary | ICD-10-CM | POA: Diagnosis present

## 2024-03-20 DIAGNOSIS — G4486 Cervicogenic headache: Secondary | ICD-10-CM | POA: Insufficient documentation

## 2024-03-20 DIAGNOSIS — M542 Cervicalgia: Secondary | ICD-10-CM | POA: Diagnosis present

## 2024-03-20 DIAGNOSIS — M25512 Pain in left shoulder: Secondary | ICD-10-CM | POA: Insufficient documentation

## 2024-03-20 DIAGNOSIS — G8929 Other chronic pain: Secondary | ICD-10-CM | POA: Insufficient documentation

## 2024-03-20 NOTE — Therapy (Signed)
 " OUTPATIENT PHYSICAL THERAPY CERVICAL EVALUATION   Patient Name: Mark Booker MRN: [id number] DOB:05/03/1940, 84 y.o., male Today's Date: 03/20/2024  END OF SESSION:  PT End of Session - 03/20/24 1005     Visit Number 1    Number of Visits 12    Date for Recertification  05/15/24    Authorization Type MCR/AARP    Progress Note Due on Visit 10    PT Start Time 1002    PT Stop Time 1038    PT Time Calculation (min) 36 min    Activity Tolerance Patient tolerated treatment well    Behavior During Therapy [facility] for tasks assessed/performed          Past Medical History:  Diagnosis Date   Hemochromatosis    Hypertriglyceridemia, essential    RBBB    Past Surgical History:  Procedure Laterality Date   BACK SURGERY     Patient Active Problem List   Diagnosis Date Noted   Erectile dysfunction 09/19/2021   Osteoarthritis 09/19/2021   Lower back pain 09/19/2021   History of asbestos exposure 09/19/2021   Gastroesophageal reflux disease 09/19/2021   Glaucoma 09/19/2021   Hemochromatosis 09/19/2021   History of adenomatous polyp of colon 09/19/2021   Hypertriglyceridemia 09/19/2021   Peyronie's disease 09/19/2021   Psoriasis 09/19/2021   Chronic rhinitis 09/19/2021   Recurrent sinusitis 09/19/2021   Generalized headache 09/19/2021    PCP: Cheryle Frees, MD  REFERRING PROVIDER: Leigh Venetia CROME, MD  REFERRING DIAG:  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 Able to relieve with BC OTC med and tylenol. Pt reports pain in the right side of neck, referring into the R shoulder. Has limited ROM. Reports onset of headaches with stress, otherwise inconsistent with activity or position. With pillow behind head, notes inc frequency of headaches (demos slightly flexed position). Intermittent and mild symptoms on the left c/s and shoulder. Symptoms range in intensity from 0/10-4/10 depending on activity or position of c/s. Last HA reported last night,  took 2 tylenol and noted improvement within 90 mins. Reports having to take [facility] and tylenol when tylenol alone does not work, 3-4 times per week just tylenol and 3 times per week BC+tylenol.   Hand dominance: Right  PERTINENT HISTORY:  Chronic headaches and right sided neck and shoulder pain  PAIN:  Are you having pain? Yes: NPRS scale: 4/10 Pain location: right c/s and shoulder Pain description: pulling, stretch, ache, tight Aggravating factors: closing moment in ipsilateral c/s  Relieving factors: rest, neutral position  PRECAUTIONS: None  RED FLAGS: Cervical red flags: Dysphagia No, Dysmetria No, Diplopia No, Nystagmus No, and Nausea No     WEIGHT BEARING RESTRICTIONS: No  FALLS:  Has patient fallen in last 6 months? No   OCCUPATION: retired nutritional therapist, used RUE frequently to lift pipes  PLOF: Independent  PATIENT GOALS: shoulder/neck to be back to normal, no headaches   NEXT MD VISIT: 09/26/24-Neuro  OBJECTIVE:  Note: Objective measures were completed at Evaluation unless otherwise noted.  DIAGNOSTIC FINDINGS:  None to date relevant to c/c  PATIENT SURVEYS:  NDI:  NECK DISABILITY INDEX  Date: 03/20/24 Score  Pain intensity 2 = The pain is moderate at the moment  2. Personal care (washing, dressing, etc.) 1 =  I can look after myself normally but it causes extra pain  3. Lifting 1 =  I can lift heavy weights but it gives extra pain  4. Reading 1 = I can read as much as I  want to with slight pain in my neck  5. Headaches 3 = I have moderate headaches, which come frequently  6. Concentration 1 =  I can concentrate fully when I want to with slight difficulty   7. Work 1 =  I can only do my usual work, but no more  8. Driving 1 =  I can drive my car as long as I want with slight pain in my neck  9. Sleeping 3 =  My sleep is moderately disturbed (2-3 hrs sleepless)  10. Recreation 2 = I am able to engage in most, but not all of my usual recreation activities because of   pain in my neck  Total 16/50   Minimum Detectable Change (90% confidence): 5 points or 10% points  COGNITION: Overall cognitive status: Within functional limits for tasks assessed  SENSATION: WFL  POSTURE: rounded shoulders and forward head  PALPATION: Inc TTP to R upper trap and medial scap, inc resting tension bilaterally R>L   CERVICAL ROM:   Active ROM A/PROM (deg) eval  Flexion 75% loss, right sided neck and shoulder pain  Extension 25% loss, right sided c/s and right shoulder pain  Right lateral flexion 50% loss, ipsilateral c/s pain and stiffness  Left lateral flexion 25% loss, right sided c/s and R shoulder stretch  Right rotation 50% loss, ipsilateral c/s and shoulder pain  Left rotation 25% loss, stretch in right c/s and shoulder   (Blank rows = not tested)  UPPER EXTREMITY ROM: WFL, right shoulder abd and flexion reproduces tightness in right upper trap   Active ROM Right eval Left eval  Shoulder flexion    Shoulder extension    Shoulder abduction    Shoulder adduction    Shoulder extension    Shoulder internal rotation    Shoulder external rotation    Elbow flexion    Elbow extension    Wrist flexion    Wrist extension    Wrist ulnar deviation    Wrist radial  deviation    Wrist pronation    Wrist supination     (Blank rows = not tested)  UPPER EXTREMITY MMT: 5/5 strength BUE, symptoms reproduced with extension, abd, add, IR, ER  MMT Right eval  Left eval  Shoulder flexion    Shoulder extension    Shoulder abduction    Shoulder adduction    Shoulder extension    Shoulder internal rotation    Shoulder external rotation    Middle trapezius    Lower trapezius    Elbow flexion    Elbow extension    Wrist flexion    Wrist extension    Wrist ulnar deviation    Wrist radial deviation    Wrist pronation    Wrist supination    Grip strength     (Blank rows = not tested)   TREATMENT DATE:   03/20/24- EVAL                                                                                                                                Baseline pain with right c/s rotation  Repeated cervical retraction in sitting x10: dec, better ROM and pain, not abolished    PATIENT EDUCATION:  Education details: Pt educated on relevant anatomy, physiology, pathology, diagnosis, prognosis, progression of care, pain and activity modification related to cervicalgia, headaches, and bilateral shoulder pain R>L. Person educated: Patient and Spouse Education method: Explanation, Demonstration, and Handouts Education comprehension: verbalized understanding and returned demonstration  HOME EXERCISE PROGRAM: Access Code: [email or web address] URL: [email or web address] Date: 03/20/2024 Prepared by: Stann Ohara  Exercises - Seated Cervical Retraction  - 5 x daily - 7 x weekly - 1 sets - 10 reps - 2 hold  ASSESSMENT:  CLINICAL IMPRESSION: Patient is a 84 y.o. M who was seen today for physical therapy evaluation and treatment for cervicogenic headaches with R shoulder pain. Symptoms are consistent with impingement of the cervical spine and neural sensitivity referring symptoms into bil shoulders, R more significant and L intermittently. He was able to demonstrate improved ROM and dec pain following repeated retraction in sitting. Pt stands to benefit from continued skilled physical therapy to address deficit areas and restore safety with activities  and participations at home and in the community.    OBJECTIVE IMPAIRMENTS: decreased activity tolerance, decreased endurance, decreased ROM, increased fascial restrictions, impaired perceived functional ability, increased muscle spasms, impaired UE functional use, and pain.   ACTIVITY LIMITATIONS: lifting, sitting, and sleeping  PARTICIPATION LIMITATIONS: cleaning, laundry, driving, community activity, and yard work  PERSONAL FACTORS: Age, Past/current experiences, Profession, and Time since onset of injury/illness/exacerbation are also affecting patient's functional outcome.   REHAB POTENTIAL: Excellent  CLINICAL DECISION MAKING: Stable/uncomplicated  EVALUATION COMPLEXITY: Low   GOALS: Goals reviewed with patient? Yes  SHORT TERM GOALS: Target date: 04/20/24   Pt will report compliance with HEP to work towards ind and home management strategies Baseline: Goal status: INITIAL   2.  Pt will score no greater than 6/50 on NDI to demonstrate improved activity tolerance Baseline: 16/50 Goal status: INITIAL   3.  Pt will improve crevical ROM to full and painless in order to demonstrate progress towards activity tolerance and improved function Baseline:  Goal status: INITIAL     LONG TERM GOALS: Target date: 05/15/24   Pt will score no greater than 0/50 on NDI to demonstrate improved activity tolerance Baseline:  Goal status: INITIAL   2.  Pt will report no greater than 0/10 pain over 7 consecutive days to demonstrate maintained reduction in symptoms and improved tolerance to activity Baseline:  Goal status: INITIAL   3.  Pt will be ind in the management of their symptoms at home and in the community Baseline:  Goal status: INITIAL     PLAN:  PT FREQUENCY: 1-2x/week  PT DURATION: 8 weeks  PLANNED INTERVENTIONS: 97110-Therapeutic exercises, 97530- Therapeutic activity, 97112- Neuromuscular re-education, 97535- Self Care, 02859- Manual therapy, G0283- Electrical  stimulation (unattended), 97016- Vasopneumatic device, 20560 (1-2 muscles), 20561 (3+ muscles)- Dry Needling, Patient/Family education, Cryotherapy, and Moist heat  PLAN FOR NEXT SESSION: soft tissue mobilization to upper quarter as indicated, modify HEP as needed, progress cervical ROM    Stann DELENA Ohara, PT 03/20/2024, 11:50 AM      "

## 2024-03-26 ENCOUNTER — Ambulatory Visit: Admitting: Physical Therapy

## 2024-03-26 ENCOUNTER — Encounter: Payer: Self-pay | Admitting: Physical Therapy

## 2024-03-26 DIAGNOSIS — G8929 Other chronic pain: Secondary | ICD-10-CM

## 2024-03-26 DIAGNOSIS — M542 Cervicalgia: Secondary | ICD-10-CM

## 2024-03-26 NOTE — Therapy (Signed)
 " OUTPATIENT PHYSICAL THERAPY CERVICAL TREATMENT   Patient Name: BUFORD BREMER MRN: [id number] DOB:21-May-1940, 84 y.o., male Today's Date: 03/26/2024  END OF SESSION:  PT End of Session - 03/26/24 1000     Visit Number 2    Number of Visits 12    Date for Recertification  05/15/24    Authorization Type MCR/AARP    Progress Note Due on Visit 10    PT Start Time 1000    PT Stop Time 1045    PT Time Calculation (min) 45 min    Activity Tolerance Patient tolerated treatment well    Behavior During Therapy [facility] for tasks assessed/performed           Past Medical History:  Diagnosis Date   Hemochromatosis    Hypertriglyceridemia, essential    RBBB    Past Surgical History:  Procedure Laterality Date   BACK SURGERY     Patient Active Problem List   Diagnosis Date Noted   Erectile dysfunction 09/19/2021   Osteoarthritis 09/19/2021   Lower back pain 09/19/2021   History of asbestos exposure 09/19/2021   Gastroesophageal reflux disease 09/19/2021   Glaucoma 09/19/2021   Hemochromatosis 09/19/2021   History of adenomatous polyp of colon 09/19/2021   Hypertriglyceridemia 09/19/2021   Peyronie's disease 09/19/2021   Psoriasis 09/19/2021   Chronic rhinitis 09/19/2021   Recurrent sinusitis 09/19/2021   Generalized headache 09/19/2021    PCP: Cheryle Frees, MD  REFERRING PROVIDER: Leigh Venetia CROME, MD  REFERRING DIAG:  [phone] (ICD-10-CM) - Cervicogenic headache    THERAPY DIAG:  Cervicalgia  Chronic right shoulder pain  Chronic left shoulder pain  Rationale for Evaluation and Treatment: Rehabilitation  ONSET DATE: 3-4 years, 6 months ago saw specialist for c/c  SUBJECTIVE:                                                                                                                                                                                                         SUBJECTIVE STATEMENT: Pt states that he has been compliant with HEP, notes that he experienced  centralization of symptoms into the upper thoracic spine, symptoms did inc so he decreased the frequency of exercise and that helped to alleviate symptoms, discussed inc frequency as needed to maintain continued symptom reduction. Pt reports 2 Headaches since previous session, notes overall improvement in symptoms and ROM, as well as headache improve. Pt endorses low back pain, discussed the ability to add to PT POC with referral for this issue.   EVAL: Pt states that he has had neck pain  and headaches x3-4 years and has been overall worsening. Able to relieve with BC OTC med and tylenol. Pt reports pain in the right side of neck, referring into the R shoulder. Has limited ROM. Reports onset of headaches with stress, otherwise inconsistent with activity or position. With pillow behind head, notes inc frequency of headaches (demos slightly flexed position). Intermittent and mild symptoms on the left c/s and shoulder. Symptoms range in intensity from 0/10-4/10 depending on activity or position of c/s. Last HA reported last night, took 2 tylenol and noted improvement within 90 mins. Reports having to take [facility] and tylenol when tylenol alone does not work, 3-4 times per week just tylenol and 3 times per week BC+tylenol.   Hand dominance: Right  PERTINENT HISTORY:  Chronic headaches and right sided neck and shoulder pain  PAIN:  Are you having pain? Yes: NPRS scale: 2/10 Pain location: right shoulder Pain description: stiffness Aggravating factors: closing moment in ipsilateral c/s  Relieving factors: rest, neutral position  PRECAUTIONS: None  RED FLAGS: Cervical red flags: Dysphagia No, Dysmetria No, Diplopia No, Nystagmus No, and Nausea No     WEIGHT BEARING RESTRICTIONS: No  FALLS:  Has patient fallen in last 6 months? No   OCCUPATION: retired nutritional therapist, used RUE frequently to lift pipes  PLOF: Independent  PATIENT GOALS: shoulder/neck to be back to normal, no headaches   NEXT MD VISIT:  09/26/24-Neuro  OBJECTIVE:  Note: Objective measures were completed at Evaluation unless otherwise noted.  DIAGNOSTIC FINDINGS:  None to date relevant to c/c  PATIENT SURVEYS:  NDI:  NECK DISABILITY INDEX  Date: 03/20/24 Score  Pain intensity 2 = The pain is moderate at the moment  2. Personal care (washing, dressing, etc.) 1 =  I can look after myself normally but it causes extra pain  3. Lifting 1 =  I can lift heavy weights but it gives extra pain  4. Reading 1 = I can read as much as I want to with slight pain in my neck  5. Headaches 3 = I have moderate headaches, which come frequently  6. Concentration 1 =  I can concentrate fully when I want to with slight difficulty   7. Work 1 =  I can only do my usual work, but no more  8. Driving 1 =  I can drive my car as long as I want with slight pain in my neck  9. Sleeping 3 =  My sleep is moderately disturbed (2-3 hrs sleepless)  10. Recreation 2 = I am able to engage in most, but not all of my usual recreation activities because of   pain in my neck  Total 16/50   Minimum Detectable Change (90% confidence): 5 points or 10% points  COGNITION: Overall cognitive status: Within functional limits for tasks assessed  SENSATION: WFL  POSTURE: rounded shoulders and forward head  PALPATION: Inc TTP to R upper trap and medial scap, inc resting tension bilaterally R>L   CERVICAL ROM:   Active ROM A/PROM (deg) eval  Flexion 75% loss, right sided neck and shoulder pain  Extension 25% loss, right sided c/s and right shoulder pain  Right lateral flexion 50% loss, ipsilateral c/s pain and stiffness  Left lateral flexion 25% loss, right sided c/s and R shoulder stretch  Right rotation 50% loss, ipsilateral c/s and shoulder pain  Left rotation 25% loss, stretch in right c/s and shoulder   (Blank rows = not tested)  UPPER EXTREMITY ROM: WFL, right shoulder  abd and flexion reproduces tightness in right upper trap   Active ROM Right eval  Left eval  Shoulder flexion    Shoulder extension    Shoulder abduction    Shoulder adduction    Shoulder extension    Shoulder internal rotation    Shoulder external rotation    Elbow flexion    Elbow extension    Wrist flexion    Wrist extension    Wrist ulnar deviation    Wrist radial deviation    Wrist pronation    Wrist supination     (Blank rows = not tested)  UPPER EXTREMITY MMT: 5/5 strength BUE, symptoms reproduced with extension, abd, add, IR, ER  MMT Right eval Left eval  Shoulder flexion    Shoulder extension    Shoulder abduction    Shoulder adduction    Shoulder extension    Shoulder internal rotation    Shoulder external rotation    Middle trapezius    Lower trapezius    Elbow flexion    Elbow extension    Wrist flexion    Wrist extension    Wrist ulnar deviation    Wrist radial deviation    Wrist pronation    Wrist supination    Grip strength     (Blank rows = not tested)   TREATMENT DATE:   [facility] Adult PT Treatment:                                                DATE: 03/26/24 Therapeutic Exercise: Thoracic spine extension in sitting 3x10 Upper trap stretch 10x10s bilaterally Slouch overcorrect x25  Shrug/retraction 3x10 Manual Therapy: Manual soft tissue mobilization to bilateral shoulders, upper trap, levator scap, and mid trap bilaterally. Inc emphasis on L upper trap and R levator scap.   03/20/24- EVAL                                                                                                                                Baseline pain with right c/s rotation  Repeated cervical retraction in sitting x10: dec, better ROM and pain, not abolished    PATIENT EDUCATION:  Education details: Pt educated on relevant anatomy, physiology, pathology, diagnosis, prognosis, progression of care, pain and activity modification related to cervicalgia, headaches, and bilateral shoulder pain R>L. Person educated: Patient and Spouse Education  method: Explanation, Demonstration, and Handouts Education comprehension: verbalized understanding and returned demonstration  HOME EXERCISE PROGRAM: Access Code: [email or web address] URL: [email or web address] Date: 03/20/2024 Prepared by: Stann Ohara  Exercises - Seated Cervical Retraction  - 5 x daily - 7 x weekly - 1 sets - 10 reps - 2 hold  ASSESSMENT:  CLINICAL IMPRESSION: Pt has been responding well to his current regimen and notes dec frequency of headaches, able to demonstrate improved cervical ROM with  less pain. Interventions progressed today to address soft tissue deficits and HEP updated accordingly. Pt stands to benefit from continued skilled physical therapy to address deficit areas and restore safety with activities and participations at home and in the community.    Patient is a 84 y.o. M who was seen today for physical therapy evaluation and treatment for cervicogenic headaches with R shoulder pain. Symptoms are consistent with impingement of the cervical spine and neural sensitivity referring symptoms into bil shoulders, R more significant and L intermittently. He was able to demonstrate improved ROM and dec pain following repeated retraction in sitting. Pt stands to benefit from continued skilled physical therapy to address deficit areas and restore safety with activities and participations at home and in the community.    OBJECTIVE IMPAIRMENTS: decreased activity tolerance, decreased endurance, decreased ROM, increased fascial restrictions, impaired perceived functional ability, increased muscle spasms, impaired UE functional use, and pain.   ACTIVITY LIMITATIONS: lifting, sitting, and sleeping  PARTICIPATION LIMITATIONS: cleaning, laundry, driving, community activity, and yard work  PERSONAL FACTORS: Age, Past/current experiences, Profession, and Time since onset of injury/illness/exacerbation are also affecting patient's functional outcome.   REHAB POTENTIAL:  Excellent  CLINICAL DECISION MAKING: Stable/uncomplicated  EVALUATION COMPLEXITY: Low   GOALS: Goals reviewed with patient? Yes  SHORT TERM GOALS: Target date: 04/20/24   Pt will report compliance with HEP to work towards ind and home management strategies Baseline: Goal status: INITIAL   2.  Pt will score no greater than 6/50 on NDI to demonstrate improved activity tolerance Baseline: 16/50 Goal status: INITIAL   3.  Pt will improve crevical ROM to full and painless in order to demonstrate progress towards activity tolerance and improved function Baseline:  Goal status: INITIAL     LONG TERM GOALS: Target date: 05/15/24   Pt will score no greater than 0/50 on NDI to demonstrate improved activity tolerance Baseline:  Goal status: INITIAL   2.  Pt will report no greater than 0/10 pain over 7 consecutive days to demonstrate maintained reduction in symptoms and improved tolerance to activity Baseline:  Goal status: INITIAL   3.  Pt will be ind in the management of their symptoms at home and in the community Baseline:  Goal status: INITIAL     PLAN:  PT FREQUENCY: 1-2x/week  PT DURATION: 8 weeks  PLANNED INTERVENTIONS: 97110-Therapeutic exercises, 97530- Therapeutic activity, 97112- Neuromuscular re-education, 97535- Self Care, 02859- Manual therapy, G0283- Electrical stimulation (unattended), 97016- Vasopneumatic device, 20560 (1-2 muscles), 20561 (3+ muscles)- Dry Needling, Patient/Family education, Cryotherapy, and Moist heat  PLAN FOR NEXT SESSION: soft tissue mobilization to upper quarter as indicated, modify HEP as needed, progress cervical ROM    Stann DELENA Ohara, PT 03/26/2024, 2:12 PM      "

## 2024-03-27 NOTE — Therapy (Unsigned)
 " OUTPATIENT PHYSICAL THERAPY CERVICAL TREATMENT   Patient Name: Mark Booker MRN: [id number] DOB:[birth year], 84 y.o., male Today's Date: 03/28/2024  END OF SESSION:  PT End of Session - 03/28/24 1214     Visit Number 3    Number of Visits 12    Date for Recertification  05/15/24    Authorization Type MCR/AARP    Progress Note Due on Visit 10    PT Start Time 1215    PT Stop Time 1255    PT Time Calculation (min) 40 min    Activity Tolerance Patient tolerated treatment well    Behavior During Therapy [facility] for tasks assessed/performed            Past Medical History:  Diagnosis Date   Hemochromatosis    Hypertriglyceridemia, essential    RBBB    Past Surgical History:  Procedure Laterality Date   BACK SURGERY     Patient Active Problem List   Diagnosis Date Noted   Erectile dysfunction 09/19/2021   Osteoarthritis 09/19/2021   Lower back pain 09/19/2021   History of asbestos exposure 09/19/2021   Gastroesophageal reflux disease 09/19/2021   Glaucoma 09/19/2021   Hemochromatosis 09/19/2021   History of adenomatous polyp of colon 09/19/2021   Hypertriglyceridemia 09/19/2021   Peyronie's disease 09/19/2021   Psoriasis 09/19/2021   Chronic rhinitis 09/19/2021   Recurrent sinusitis 09/19/2021   Generalized headache 09/19/2021    PCP: Cheryle Frees, MD  REFERRING PROVIDER: Leigh Venetia CROME, MD  REFERRING DIAG:  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    EVAL: Pt states that he has had neck pain and headaches x3-4 years and has been overall worsening. Able to relieve with BC OTC med and tylenol. Pt reports pain in the right side of neck, referring into the R shoulder. Has limited ROM. Reports onset of headaches with stress, otherwise inconsistent with activity or position. With pillow behind head, notes inc frequency of headaches (demos slightly flexed position). Intermittent and mild symptoms on the left c/s and  shoulder. Symptoms range in intensity from 0/10-4/10 depending on activity or position of c/s. Last HA reported last night, took 2 tylenol and noted improvement within 90 mins. Reports having to take [facility] and tylenol when tylenol alone does not work, 3-4 times per week just tylenol and 3 times per week BC+tylenol.   Hand dominance: Right  PERTINENT HISTORY:  Chronic headaches and right sided neck and shoulder pain  PAIN:  Are you having pain? Yes: NPRS scale: 2/10 Pain location: right shoulder Pain description: stiffness Aggravating factors: closing moment in ipsilateral c/s  Relieving factors: rest, neutral position  PRECAUTIONS: None  RED FLAGS: Cervical red flags: Dysphagia No, Dysmetria No, Diplopia No, Nystagmus No, and Nausea No     WEIGHT BEARING RESTRICTIONS: No  FALLS:  Has patient fallen in last 6 months? No   OCCUPATION: retired nutritional therapist, used RUE frequently to lift pipes  PLOF: Independent  PATIENT GOALS: shoulder/neck to be back to normal, no headaches   NEXT MD VISIT: 09/26/24-Neuro  OBJECTIVE:  Note: Objective measures were completed at Evaluation unless otherwise noted.  DIAGNOSTIC FINDINGS:  None to date relevant to c/c  PATIENT SURVEYS:  NDI:  NECK DISABILITY INDEX  Date: 03/20/24 Score  Pain intensity 2 = The pain is moderate at the moment  2. Personal care (washing, dressing, etc.) 1 =  I can look after myself normally but it causes extra pain  3. Lifting 1 =  I can lift heavy  weights but it gives extra pain  4. Reading 1 = I can read as much as I want to with slight pain in my neck  5. Headaches 3 = I have moderate headaches, which come frequently  6. Concentration 1 =  I can concentrate fully when I want to with slight difficulty   7. Work 1 =  I can only do my usual work, but no more  8. Driving 1 =  I can drive my car as long as I want with slight pain in my neck  9. Sleeping 3 =  My sleep is moderately disturbed (2-3 hrs sleepless)  10. Recreation 2 = I am able to engage in most, but not all of my usual recreation activities because of   pain in my neck  Total 16/50   Minimum Detectable Change (90% confidence): 5 points or 10% points  COGNITION: Overall cognitive status: Within functional limits for tasks assessed  SENSATION: WFL  POSTURE: rounded shoulders and forward head  PALPATION: Inc TTP to R upper trap and medial scap, inc resting tension bilaterally R>L   CERVICAL ROM:   Active ROM A/PROM (deg) eval  Flexion 75% loss, right sided neck and shoulder pain  Extension 25% loss, right sided c/s and right shoulder pain  Right lateral flexion 50% loss, ipsilateral c/s pain and stiffness  Left lateral flexion 25% loss, right sided c/s and R shoulder stretch  Right rotation 50% loss, ipsilateral c/s and shoulder pain  Left rotation 25% loss, stretch in right c/s and shoulder   (Blank rows = not tested)  UPPER EXTREMITY ROM: WFL, right shoulder abd and flexion reproduces tightness in right upper trap   Active ROM Right eval Left eval  Shoulder flexion    Shoulder extension    Shoulder abduction    Shoulder adduction    Shoulder extension    Shoulder internal rotation    Shoulder external rotation    Elbow flexion    Elbow extension    Wrist flexion  Wrist extension    Wrist ulnar deviation    Wrist radial deviation    Wrist pronation    Wrist supination     (Blank rows = not tested)  UPPER EXTREMITY MMT: 5/5 strength BUE,  symptoms reproduced with extension, abd, add, IR, ER  MMT Right eval Left eval  Shoulder flexion    Shoulder extension    Shoulder abduction    Shoulder adduction    Shoulder extension    Shoulder internal rotation    Shoulder external rotation    Middle trapezius    Lower trapezius    Elbow flexion    Elbow extension    Wrist flexion    Wrist extension    Wrist ulnar deviation    Wrist radial deviation    Wrist pronation    Wrist supination    Grip strength     (Blank rows = not tested)   TREATMENT DATE:  [facility] Adult PT Treatment:                                                DATE: 03/28/24 Therapeutic Exercise: Nustep L2 8 min Manual Therapy: Levator stretch 30s x2 B UT stretch 30s x2 B Scalene stretch 30sx3  Therapeutic Activity: Supine OH flexion 15/15 1# Supine hor abd  Supine chin tuck over yoga block 3x10  [facility] Adult PT Treatment:                                                DATE: 03/26/24 Therapeutic Exercise: Thoracic spine extension in sitting 3x10 Upper trap stretch 10x10s bilaterally Slouch overcorrect x25  Shrug/retraction 3x10 Manual Therapy: Manual soft tissue mobilization to bilateral shoulders, upper trap, levator scap, and mid trap bilaterally. Inc emphasis on L upper trap and R levator scap.   03/20/24- EVAL                                                                                                                                Baseline pain with right c/s rotation  Repeated cervical retraction in sitting x10: dec, better ROM and pain, not abolished    PATIENT EDUCATION:  Education details: Pt educated on relevant anatomy, physiology, pathology, diagnosis, prognosis, progression of care, pain and activity modification related to cervicalgia, headaches, and bilateral shoulder pain R>L. Person educated: Patient and Spouse Education method: Explanation, Demonstration, and Handouts Education comprehension: verbalized understanding and  returned demonstration  HOME EXERCISE PROGRAM: Access Code: [email or web address] URL: [email or web address] Date: 03/20/2024 Prepared by: Stann Ohara  Exercises - Seated Cervical Retraction  - 5 x daily - 7 x weekly - 1 sets - 10 reps - 2 hold  ASSESSMENT:  CLINICAL  IMPRESSION:  Focus of today was aerobic w/o followed by manual stretching by PT to determine soft tissue extensibility.  Definite myofascial component identified by tactile feel and cervical mobility restrictions globally.  Incorporated supine strengthening for soft tissue mobilization and posture control.   Pt has been responding well to his current regimen and notes dec frequency of headaches, able to demonstrate improved cervical ROM with less pain. Interventions progressed today to address soft tissue deficits and HEP updated accordingly. Pt stands to benefit from continued skilled physical therapy to address deficit areas and restore safety with activities and participations at home and in the community.    Patient is a 84 y.o. M who was seen today for physical therapy evaluation and treatment for cervicogenic headaches with R shoulder pain. Symptoms are consistent with impingement of the cervical spine and neural sensitivity referring symptoms into bil shoulders, R more significant and L intermittently. He was able to demonstrate improved ROM and dec pain following repeated retraction in sitting. Pt stands to benefit from continued skilled physical therapy to address deficit areas and restore safety with activities and participations at home and in the community.    OBJECTIVE IMPAIRMENTS: decreased activity tolerance, decreased endurance, decreased ROM, increased fascial restrictions, impaired perceived functional ability, increased muscle spasms, impaired UE functional use, and pain.   ACTIVITY LIMITATIONS: lifting, sitting, and sleeping  PARTICIPATION LIMITATIONS: cleaning, laundry, driving, community activity, and  yard work  PERSONAL FACTORS: Age, Past/current experiences, Profession, and Time since onset of injury/illness/exacerbation are also affecting patient's functional outcome.   REHAB POTENTIAL: Excellent  CLINICAL DECISION MAKING: Stable/uncomplicated  EVALUATION COMPLEXITY: Low   GOALS: Goals reviewed with patient? Yes  SHORT TERM GOALS: Target date: 04/20/24   Pt will report compliance with HEP to work towards ind and home management strategies Baseline: Goal status: INITIAL   2.  Pt will score no greater than 6/50 on NDI to demonstrate improved activity tolerance Baseline: 16/50 Goal status: INITIAL   3.  Pt will improve crevical ROM to full and painless in order to demonstrate progress towards activity tolerance and improved function Baseline:  Goal status: INITIAL     LONG TERM GOALS: Target date: 05/15/24   Pt will score no greater than 0/50 on NDI to demonstrate improved activity tolerance Baseline:  Goal status: INITIAL   2.  Pt will report no greater than 0/10 pain over 7 consecutive days to demonstrate maintained reduction in symptoms and improved tolerance to activity Baseline:  Goal status: INITIAL   3.  Pt will be ind in the management of their symptoms at home and in the community Baseline:  Goal status: INITIAL     PLAN:  PT FREQUENCY: 1-2x/week  PT DURATION: 8 weeks  PLANNED INTERVENTIONS: 97110-Therapeutic exercises, 97530- Therapeutic activity, [id number]- Neuromuscular re-education, 97535- Self Care, 02859- Manual therapy, [id number]- Electrical stimulation (unattended), 97016- Vasopneumatic device, 20560 (1-2 muscles), 20561 (3+ muscles)- Dry Needling, Patient/Family education, Cryotherapy, and Moist heat  PLAN FOR NEXT SESSION: soft tissue mobilization to upper quarter as indicated, modify HEP as needed, progress cervical ROM    Reyes CHRISTELLA Kohut, PT 03/28/2024, 1:00 PM      "

## 2024-03-28 ENCOUNTER — Ambulatory Visit

## 2024-03-28 DIAGNOSIS — M542 Cervicalgia: Secondary | ICD-10-CM

## 2024-03-28 DIAGNOSIS — G8929 Other chronic pain: Secondary | ICD-10-CM

## 2024-03-31 ENCOUNTER — Encounter: Payer: Self-pay | Admitting: Physical Therapy

## 2024-03-31 ENCOUNTER — Ambulatory Visit: Admitting: Physical Therapy

## 2024-03-31 DIAGNOSIS — M542 Cervicalgia: Secondary | ICD-10-CM

## 2024-03-31 DIAGNOSIS — G8929 Other chronic pain: Secondary | ICD-10-CM

## 2024-03-31 NOTE — Therapy (Signed)
 " OUTPATIENT PHYSICAL THERAPY CERVICAL TREATMENT   Patient Name: ZEFERINO MOUNTS MRN: [id number] DOB:1941/03/04, 84 y.o., male Today's Date: 03/31/2024  END OF SESSION:  PT End of Session - 03/31/24 1119     Visit Number 4    Number of Visits 12    Date for Recertification  05/15/24    Authorization Type MCR/AARP    Progress Note Due on Visit 10    PT Start Time 1120    PT Stop Time 1215    PT Time Calculation (min) 55 min    Activity Tolerance Patient tolerated treatment well    Behavior During Therapy [facility] for tasks assessed/performed             Past Medical History:  Diagnosis Date   Hemochromatosis    Hypertriglyceridemia, essential    RBBB    Past Surgical History:  Procedure Laterality Date   BACK SURGERY     Patient Active Problem List   Diagnosis Date Noted   Erectile dysfunction 09/19/2021   Osteoarthritis 09/19/2021   Lower back pain 09/19/2021   History of asbestos exposure 09/19/2021   Gastroesophageal reflux disease 09/19/2021   Glaucoma 09/19/2021   Hemochromatosis 09/19/2021   History of adenomatous polyp of colon 09/19/2021   Hypertriglyceridemia 09/19/2021   Peyronie's disease 09/19/2021   Psoriasis 09/19/2021   Chronic rhinitis 09/19/2021   Recurrent sinusitis 09/19/2021   Generalized headache 09/19/2021    PCP: Cheryle Frees, MD  REFERRING PROVIDER: Leigh Venetia CROME, MD  REFERRING DIAG:  [phone] (ICD-10-CM) - Cervicogenic headache    THERAPY DIAG:  Cervicalgia  Chronic right shoulder pain  Chronic left shoulder pain  Rationale for Evaluation and Treatment: Rehabilitation  ONSET DATE: 3-4 years, 6 months ago saw specialist for c/c  SUBJECTIVE:                                                                                                                                                                                                         SUBJECTIVE STATEMENT:   Pt reports that he has been doing well since his previous session,  reports compliance with HEP, requests to review for quality of movements. Pt denies pain today. Headaches x1 last Friday. Overall improving since beginning PT.    EVAL: Pt states that he has had neck pain and headaches x3-4 years and has been overall worsening. Able to relieve with BC OTC med and tylenol. Pt reports pain in the right side of neck, referring into the R shoulder. Has limited ROM. Reports onset of headaches with stress, otherwise inconsistent  with activity or position. With pillow behind head, notes inc frequency of headaches (demos slightly flexed position). Intermittent and mild symptoms on the left c/s and shoulder. Symptoms range in intensity from 0/10-4/10 depending on activity or position of c/s. Last HA reported last night, took 2 tylenol and noted improvement within 90 mins. Reports having to take [facility] and tylenol when tylenol alone does not work, 3-4 times per week just tylenol and 3 times per week BC+tylenol.   Hand dominance: Right  PERTINENT HISTORY:  Chronic headaches and right sided neck and shoulder pain  PAIN:  Are you having pain? Yes: NPRS scale: 0/10 today Pain location: right shoulder Pain description: stiffness Aggravating factors: closing moment in ipsilateral c/s  Relieving factors: rest, neutral position  PRECAUTIONS: None  RED FLAGS: Cervical red flags: Dysphagia No, Dysmetria No, Diplopia No, Nystagmus No, and Nausea No     WEIGHT BEARING RESTRICTIONS: No  FALLS:  Has patient fallen in last 6 months? No   OCCUPATION: retired nutritional therapist, used RUE frequently to lift pipes  PLOF: Independent  PATIENT GOALS: shoulder/neck to be back to normal, no headaches   NEXT MD VISIT: 09/26/24-Neuro  OBJECTIVE:  Note: Objective measures were completed at Evaluation unless otherwise noted.  DIAGNOSTIC FINDINGS:  None to date relevant to c/c  PATIENT SURVEYS:  NDI:  NECK DISABILITY INDEX  Date: 03/20/24 Score  Pain intensity 2 = The pain is moderate at the  moment  2. Personal care (washing, dressing, etc.) 1 =  I can look after myself normally but it causes extra pain  3. Lifting 1 =  I can lift heavy weights but it gives extra pain  4. Reading 1 = I can read as much as I want to with slight pain in my neck  5. Headaches 3 = I have moderate headaches, which come frequently  6. Concentration 1 =  I can concentrate fully when I want to with slight difficulty   7. Work 1 =  I can only do my usual work, but no more  8. Driving 1 =  I can drive my car as long as I want with slight pain in my neck  9. Sleeping 3 =  My sleep is moderately disturbed (2-3 hrs sleepless)  10. Recreation 2 = I am able to engage in most, but not all of my usual recreation activities because of   pain in my neck  Total 16/50   Minimum Detectable Change (90% confidence): 5 points or 10% points  COGNITION: Overall cognitive status: Within functional limits for tasks assessed  SENSATION: WFL  POSTURE: rounded shoulders and forward head  PALPATION: Inc TTP to R upper trap and medial scap, inc resting tension bilaterally R>L   CERVICAL ROM:   Active ROM A/PROM (deg) eval  Flexion 75% loss, right sided neck and shoulder pain  Extension 25% loss, right sided c/s and right shoulder pain  Right lateral flexion 50% loss, ipsilateral c/s pain and stiffness  Left lateral flexion 25% loss, right sided c/s and R shoulder stretch  Right rotation 50% loss, ipsilateral c/s and shoulder pain  Left rotation 25% loss, stretch in right c/s and shoulder   (Blank rows = not tested)  UPPER EXTREMITY ROM: WFL, right shoulder abd and flexion reproduces tightness in right upper trap   Active ROM Right eval Left eval  Shoulder flexion    Shoulder extension    Shoulder abduction    Shoulder adduction    Shoulder extension  Shoulder internal rotation    Shoulder external rotation    Elbow flexion    Elbow extension    Wrist flexion    Wrist extension    Wrist ulnar  deviation    Wrist radial deviation    Wrist pronation    Wrist supination     (Blank rows = not tested)  UPPER EXTREMITY MMT: 5/5 strength BUE, symptoms reproduced with extension, abd, add, IR, ER  MMT Right eval Left eval  Shoulder flexion    Shoulder extension    Shoulder abduction    Shoulder adduction    Shoulder extension    Shoulder internal rotation    Shoulder external rotation    Middle trapezius    Lower trapezius    Elbow flexion    Elbow extension    Wrist flexion    Wrist extension    Wrist ulnar deviation    Wrist radial deviation    Wrist pronation    Wrist supination    Grip strength     (Blank rows = not tested)   TREATMENT DATE:  [facility] Adult PT Treatment:                                                DATE: 03/31/24 Therapeutic Exercise: UBE x6 mins 3 FW 3 BW, seat 8 OH press 3x8 BUE alternating between reps, 4# DB Reviewed HEP at beginning of session for appropriate movements and technique, updated HEP to include self suboccipital release Manual Therapy: IASTM completed to R shoulder complex. Pt developed light redness consistent with treatment goals to improve efficiency of tissue remodeling principles. Small strokes completed in line with tissue orientation with inc time spent right supraspinatus and upper trap. Pt tolerated tx well, no adverse response.  Trigger point release to supraspinatus RUE x90s with noted release during and pt reported improvement following Suboccipital release in supine x5 mins with improved symptoms in base of occiput following, pt unable to reproduce symptoms    [facility] Adult PT Treatment:                                                DATE: 03/28/24 Therapeutic Exercise: Nustep L2 8 min Manual Therapy: Levator stretch 30s x2 B UT stretch 30s x2 B Scalene stretch 30sx3  Therapeutic Activity: Supine OH flexion 15/15 1# Supine hor abd  Supine chin tuck over yoga block 3x10  [facility] Adult PT Treatment:                                                 DATE: 03/26/24 Therapeutic Exercise: Thoracic spine extension in sitting 3x10 Upper trap stretch 10x10s bilaterally Slouch overcorrect x25  Shrug/retraction 3x10 Manual Therapy: Manual soft tissue mobilization to bilateral shoulders, upper trap, levator scap, and mid trap bilaterally. Inc emphasis on L upper trap and R levator scap.   03/20/24- EVAL  Baseline pain with right c/s rotation  Repeated cervical retraction in sitting x10: dec, better ROM and pain, not abolished    PATIENT EDUCATION:  Education details: Pt educated on relevant anatomy, physiology, pathology, diagnosis, prognosis, progression of care, pain and activity modification related to cervicalgia, headaches, and bilateral shoulder pain R>L. Person educated: Patient and Spouse Education method: Explanation, Demonstration, and Handouts Education comprehension: verbalized understanding and returned demonstration  HOME EXERCISE PROGRAM: Access Code: [email or web address] URL: [email or web address] Date: 03/31/2024 Prepared by: Stann Ohara  Exercises - Seated Cervical Retraction  - 2-5 x daily - 7 x weekly - 2 sets - 5 reps - 2-5s hold - Seated Thoracic Lumbar Extension  - 1 x daily - 4 x weekly - 3 sets - 10 reps - 2 hold - Seated Gentle Upper Trapezius Stretch  - 1 x daily - 4 x weekly - 1 sets - 10 reps - 5s hold - Seated Shoulder Shrugs  - 1 x daily - 4 x weekly - 3 sets - 10 reps - 2 hold - Correct Seated Posture  - 1 x daily - 4 x weekly - 1 sets - 25 reps - 2 hold  ASSESSMENT:  CLINICAL IMPRESSION:   Pt tolerated session well, able to address soft tissue deficits with manual techniques and followed up with functional movement patterns to progress quality of sift tissue alignment and improve activity tolerance. Pt reports improved tension noted in the left c/s  and shoulder. Appropriate workload provided today with beginning of fatigue noted, anticipate good recovery and continued progress with PT.    Patient is a 84 y.o. M who was seen today for physical therapy evaluation and treatment for cervicogenic headaches with R shoulder pain. Symptoms are consistent with impingement of the cervical spine and neural sensitivity referring symptoms into bil shoulders, R more significant and L intermittently. He was able to demonstrate improved ROM and dec pain following repeated retraction in sitting. Pt stands to benefit from continued skilled physical therapy to address deficit areas and restore safety with activities and participations at home and in the community.    OBJECTIVE IMPAIRMENTS: decreased activity tolerance, decreased endurance, decreased ROM, increased fascial restrictions, impaired perceived functional ability, increased muscle spasms, impaired UE functional use, and pain.   ACTIVITY LIMITATIONS: lifting, sitting, and sleeping  PARTICIPATION LIMITATIONS: cleaning, laundry, driving, community activity, and yard work  PERSONAL FACTORS: Age, Past/current experiences, Profession, and Time since onset of injury/illness/exacerbation are also affecting patient's functional outcome.   REHAB POTENTIAL: Excellent  CLINICAL DECISION MAKING: Stable/uncomplicated  EVALUATION COMPLEXITY: Low   GOALS: Goals reviewed with patient? Yes  SHORT TERM GOALS: Target date: 04/20/24   Pt will report compliance with HEP to work towards ind and home management strategies Baseline: Goal status: INITIAL   2.  Pt will score no greater than 6/50 on NDI to demonstrate improved activity tolerance Baseline: 16/50 Goal status: INITIAL   3.  Pt will improve crevical ROM to full and painless in order to demonstrate progress towards activity tolerance and improved function Baseline:  Goal status: INITIAL     LONG TERM GOALS: Target date: 05/15/24   Pt will score no  greater than 0/50 on NDI to demonstrate improved activity tolerance Baseline:  Goal status: INITIAL   2.  Pt will report no greater than 0/10 pain over 7 consecutive days to demonstrate maintained reduction in symptoms and improved tolerance to activity Baseline:  Goal status: INITIAL   3.  Pt will be ind in the management  of their symptoms at home and in the community Baseline:  Goal status: INITIAL     PLAN:  PT FREQUENCY: 1-2x/week  PT DURATION: 8 weeks  PLANNED INTERVENTIONS: 97110-Therapeutic exercises, 97530- Therapeutic activity, [id number]- Neuromuscular re-education, 97535- Self Care, 02859- Manual therapy, [id number]- Electrical stimulation (unattended), 97016- Vasopneumatic device, 20560 (1-2 muscles), 20561 (3+ muscles)- Dry Needling, Patient/Family education, Cryotherapy, and Moist heat  PLAN FOR NEXT SESSION: soft tissue mobilization to upper quarter as indicated, modify HEP as needed, progress cervical ROM    Stann DELENA Ohara, PT 03/31/2024, 11:20 AM      "

## 2024-04-03 NOTE — Therapy (Signed)
 " OUTPATIENT PHYSICAL THERAPY CERVICAL TREATMENT   Patient Name: Mark Booker MRN: [id number] DOB:1940-05-27, 84 y.o., male Today's Date: 04/04/2024  END OF SESSION:  PT End of Session - 04/04/24 1111     Visit Number 5    Number of Visits 12    Date for Recertification  05/15/24    Authorization Type MCR/[employer]    Progress Note Due on Visit 10    PT Start Time 1116    PT Stop Time 1200    PT Time Calculation (min) 44 min    Activity Tolerance Patient tolerated treatment well    Behavior During Therapy [facility] for tasks assessed/performed              Past Medical History:  Diagnosis Date   Hemochromatosis    Hypertriglyceridemia, essential    RBBB    Past Surgical History:  Procedure Laterality Date   BACK SURGERY     Patient Active Problem List   Diagnosis Date Noted   Erectile dysfunction 09/19/2021   Osteoarthritis 09/19/2021   Lower back pain 09/19/2021   History of asbestos exposure 09/19/2021   Gastroesophageal reflux disease 09/19/2021   Glaucoma 09/19/2021   Hemochromatosis 09/19/2021   History of adenomatous polyp of colon 09/19/2021   Hypertriglyceridemia 09/19/2021   Peyronie's disease 09/19/2021   Psoriasis 09/19/2021   Chronic rhinitis 09/19/2021   Recurrent sinusitis 09/19/2021   Generalized headache 09/19/2021    PCP: Cheryle Frees, MD  REFERRING PROVIDER: Leigh Venetia CROME, MD  REFERRING DIAG:  [phone] (ICD-10-CM) - Cervicogenic headache    THERAPY DIAG:  Cervicalgia  Chronic right shoulder pain  Chronic left shoulder pain  Rationale for Evaluation and Treatment: Rehabilitation  ONSET DATE: 3-4 years, 6 months ago saw specialist for c/c  SUBJECTIVE:                                                                                                                                                                                                         SUBJECTIVE STATEMENT:  Pt states that he has been doing well since his previous session,  reports compliance with HEP. Denies pain or discomfort today   EVAL: Pt states that he has had neck pain and headaches x3-4 years and has been overall worsening. Able to relieve with BC OTC med and tylenol. Pt reports pain in the right side of neck, referring into the R shoulder. Has limited ROM. Reports onset of headaches with stress, otherwise inconsistent with activity or position. With pillow behind head, notes inc frequency of headaches (demos slightly flexed  position). Intermittent and mild symptoms on the left c/s and shoulder. Symptoms range in intensity from 0/10-4/10 depending on activity or position of c/s. Last HA reported last night, took 2 tylenol and noted improvement within 90 mins. Reports having to take [facility] and tylenol when tylenol alone does not work, 3-4 times per week just tylenol and 3 times per week BC+tylenol.   Hand dominance: Right  PERTINENT HISTORY:  Chronic headaches and right sided neck and shoulder pain  PAIN:  Are you having pain? Yes: NPRS scale: 0/10 today Pain location: right shoulder Pain description: stiffness Aggravating factors: closing moment in ipsilateral c/s  Relieving factors: rest, neutral position  PRECAUTIONS: None  RED FLAGS: Cervical red flags: Dysphagia No, Dysmetria No, Diplopia No, Nystagmus No, and Nausea No     WEIGHT BEARING RESTRICTIONS: No  FALLS:  Has patient fallen in last 6 months? No   OCCUPATION: retired nutritional therapist, used RUE frequently to lift pipes  PLOF: Independent  PATIENT GOALS: shoulder/neck to be back to normal, no headaches   NEXT MD VISIT: 09/26/24-Neuro  OBJECTIVE:  Note: Objective measures were completed at Evaluation unless otherwise noted.  DIAGNOSTIC FINDINGS:  None to date relevant to c/c  PATIENT SURVEYS:  NDI:  NECK DISABILITY INDEX  Date: 03/20/24 Score  Pain intensity 2 = The pain is moderate at the moment  2. Personal care (washing, dressing, etc.) 1 =  I can look after myself normally but it  causes extra pain  3. Lifting 1 =  I can lift heavy weights but it gives extra pain  4. Reading 1 = I can read as much as I want to with slight pain in my neck  5. Headaches 3 = I have moderate headaches, which come frequently  6. Concentration 1 =  I can concentrate fully when I want to with slight difficulty   7. Work 1 =  I can only do my usual work, but no more  8. Driving 1 =  I can drive my car as long as I want with slight pain in my neck  9. Sleeping 3 =  My sleep is moderately disturbed (2-3 hrs sleepless)  10. Recreation 2 = I am able to engage in most, but not all of my usual recreation activities because of   pain in my neck  Total 16/50   Minimum Detectable Change (90% confidence): 5 points or 10% points  COGNITION: Overall cognitive status: Within functional limits for tasks assessed  SENSATION: WFL  POSTURE: rounded shoulders and forward head  PALPATION: Inc TTP to R upper trap and medial scap, inc resting tension bilaterally R>L   CERVICAL ROM:   Active ROM A/PROM (deg) eval  Flexion 75% loss, right sided neck and shoulder pain  Extension 25% loss, right sided c/s and right shoulder pain  Right lateral flexion 50% loss, ipsilateral c/s pain and stiffness  Left lateral flexion 25% loss, right sided c/s and R shoulder stretch  Right rotation 50% loss, ipsilateral c/s and shoulder pain  Left rotation 25% loss, stretch in right c/s and shoulder   (Blank rows = not tested)  UPPER EXTREMITY ROM: WFL, right shoulder abd and flexion reproduces tightness in right upper trap   Active ROM Right eval Left eval  Shoulder flexion    Shoulder extension    Shoulder abduction    Shoulder adduction    Shoulder extension    Shoulder internal rotation    Shoulder external rotation    Elbow flexion  Elbow extension    Wrist flexion    Wrist extension    Wrist ulnar deviation    Wrist radial deviation    Wrist pronation    Wrist supination     (Blank rows = not  tested)  UPPER EXTREMITY MMT: 5/5 strength BUE, symptoms reproduced with extension, abd, add, IR, ER  MMT Right eval Left eval  Shoulder flexion    Shoulder extension    Shoulder abduction    Shoulder adduction    Shoulder extension    Shoulder internal rotation    Shoulder external rotation    Middle trapezius    Lower trapezius    Elbow flexion    Elbow extension    Wrist flexion    Wrist extension    Wrist ulnar deviation    Wrist radial deviation    Wrist pronation    Wrist supination    Grip strength     (Blank rows = not tested)   TREATMENT DATE:   [facility] Adult PT Treatment:                                                DATE: 04/04/24 Therapeutic Exercise: UBE 3 mins FW 3 BW level 2, seat 7, handles extended all the way Wall pushups on physioball 3x10  OH lat stretch 3x10 with physioball to inc stability and contraction in overhead position Resisted shoulder extension 3x10 bilaterally with green TB, add scapular retraction Deep swimmers press 3x8 with 5# alternating between reps    [facility] Adult PT Treatment:                                                DATE: 03/31/24 Therapeutic Exercise: UBE x6 mins 3 FW 3 BW, seat 8 OH press 3x8 BUE alternating between reps, 4# DB Reviewed HEP at beginning of session for appropriate movements and technique, updated HEP to include self suboccipital release Manual Therapy: IASTM completed to R shoulder complex. Pt developed light redness consistent with treatment goals to improve efficiency of tissue remodeling principles. Small strokes completed in line with tissue orientation with inc time spent right supraspinatus and upper trap. Pt tolerated tx well, no adverse response.  Trigger point release to supraspinatus RUE x90s with noted release during and pt reported improvement following Suboccipital release in supine x5 mins with improved symptoms in base of occiput following, pt unable to reproduce symptoms    [facility] Adult PT  Treatment:                                                DATE: 03/28/24 Therapeutic Exercise: Nustep L2 8 min Manual Therapy: Levator stretch 30s x2 B UT stretch 30s x2 B Scalene stretch 30sx3  Therapeutic Activity: Supine OH flexion 15/15 1# Supine hor abd  Supine chin tuck over yoga block 3x10  [facility] Adult PT Treatment:  DATE: 03/26/24 Therapeutic Exercise: Thoracic spine extension in sitting 3x10 Upper trap stretch 10x10s bilaterally Slouch overcorrect x25  Shrug/retraction 3x10 Manual Therapy: Manual soft tissue mobilization to bilateral shoulders, upper trap, levator scap, and mid trap bilaterally. Inc emphasis on L upper trap and R levator scap.   03/20/24- EVAL                                                                                                                                Baseline pain with right c/s rotation  Repeated cervical retraction in sitting x10: dec, better ROM and pain, not abolished    PATIENT EDUCATION:  Education details: Pt educated on relevant anatomy, physiology, pathology, diagnosis, prognosis, progression of care, pain and activity modification related to cervicalgia, headaches, and bilateral shoulder pain R>L. Person educated: Patient and Spouse Education method: Explanation, Demonstration, and Handouts Education comprehension: verbalized understanding and returned demonstration  HOME EXERCISE PROGRAM: Access Code: [email or web address] URL: [email or web address] Date: 04/04/2024 Prepared by: Stann Ohara  Exercises - Seated Cervical Retraction  - 2-5 x daily - 7 x weekly - 2 sets - 5 reps - 2-5s hold - Seated Thoracic Lumbar Extension  - 1 x daily - 4 x weekly - 3 sets - 10 reps - 2 hold - Seated Gentle Upper Trapezius Stretch  - 1 x daily - 4 x weekly - 1 sets - 10 reps - 5s hold - Supine Suboccipital Release with Tennis Balls  - 3 x weekly - 1 sets - 1 reps - 5 min  hold - Push Up on  Swiss Ball  - 1 x daily - 4 x weekly - 3 sets - 10 reps - 2 hold - Overhead press, in chair  - 1 x daily - 4 x weekly - 3 sets - 8-10 reps - 2 hold - Scapular Retraction with Resistance Advanced  - 1 x daily - 4 x weekly - 3 sets - 10 reps - 2 hold  ASSESSMENT:  CLINICAL IMPRESSION:   Pt tolerated session well and has been responding very well to PT POC. Denies symptoms today and able to progress towards strength, stability and endurance through functional movement patterns. Discussed motor trigger point dry needling for right upper trap, although symptoms have been resolving well and may not be indicated. Pt to think about this option and knows we can integrate it as indicated. HEP modified and pt reduced to 1 time per week. Pt stands to benefit from continued skilled physical therapy to address deficit areas and restore safety with activities and participations at home and in the community.     Patient is a 84 y.o. M who was seen today for physical therapy evaluation and treatment for cervicogenic headaches with R shoulder pain. Symptoms are consistent with impingement of the cervical spine and neural sensitivity referring symptoms into bil shoulders, R more significant and L intermittently. He was able to demonstrate improved ROM and dec pain following repeated  retraction in sitting. Pt stands to benefit from continued skilled physical therapy to address deficit areas and restore safety with activities and participations at home and in the community.    OBJECTIVE IMPAIRMENTS: decreased activity tolerance, decreased endurance, decreased ROM, increased fascial restrictions, impaired perceived functional ability, increased muscle spasms, impaired UE functional use, and pain.   ACTIVITY LIMITATIONS: lifting, sitting, and sleeping  PARTICIPATION LIMITATIONS: cleaning, laundry, driving, community activity, and yard work  PERSONAL FACTORS: Age, Past/current experiences, Profession, and Time since  onset of injury/illness/exacerbation are also affecting patient's functional outcome.   REHAB POTENTIAL: Excellent  CLINICAL DECISION MAKING: Stable/uncomplicated  EVALUATION COMPLEXITY: Low   GOALS: Goals reviewed with patient? Yes  SHORT TERM GOALS: Target date: 04/20/24   Pt will report compliance with HEP to work towards ind and home management strategies Baseline: Goal status: INITIAL   2.  Pt will score no greater than 6/50 on NDI to demonstrate improved activity tolerance Baseline: 16/50 Goal status: INITIAL   3.  Pt will improve crevical ROM to full and painless in order to demonstrate progress towards activity tolerance and improved function Baseline:  Goal status: INITIAL     LONG TERM GOALS: Target date: 05/15/24   Pt will score no greater than 0/50 on NDI to demonstrate improved activity tolerance Baseline:  Goal status: INITIAL   2.  Pt will report no greater than 0/10 pain over 7 consecutive days to demonstrate maintained reduction in symptoms and improved tolerance to activity Baseline:  Goal status: INITIAL   3.  Pt will be ind in the management of their symptoms at home and in the community Baseline:  Goal status: INITIAL     PLAN:  PT FREQUENCY: 1-2x/week  PT DURATION: 8 weeks  PLANNED INTERVENTIONS: 97110-Therapeutic exercises, 97530- Therapeutic activity, 97112- Neuromuscular re-education, 97535- Self Care, 02859- Manual therapy, [id number]- Electrical stimulation (unattended), 97016- Vasopneumatic device, 20560 (1-2 muscles), 20561 (3+ muscles)- Dry Needling, Patient/Family education, Cryotherapy, and Moist heat  PLAN FOR NEXT SESSION: soft tissue mobilization to upper quarter as indicated, modify HEP as needed, progress cervical ROM    Stann DELENA Ohara, PT 04/04/2024, 11:12 AM      "

## 2024-04-04 ENCOUNTER — Encounter: Payer: Self-pay | Admitting: Physical Therapy

## 2024-04-04 ENCOUNTER — Ambulatory Visit: Admitting: Physical Therapy

## 2024-04-04 DIAGNOSIS — M542 Cervicalgia: Secondary | ICD-10-CM | POA: Diagnosis not present

## 2024-04-04 DIAGNOSIS — G8929 Other chronic pain: Secondary | ICD-10-CM

## 2024-04-07 ENCOUNTER — Ambulatory Visit

## 2024-04-11 ENCOUNTER — Ambulatory Visit: Admitting: Physical Therapy

## 2024-04-11 ENCOUNTER — Encounter: Payer: Self-pay | Admitting: Physical Therapy

## 2024-04-11 DIAGNOSIS — M542 Cervicalgia: Secondary | ICD-10-CM

## 2024-04-11 DIAGNOSIS — G8929 Other chronic pain: Secondary | ICD-10-CM

## 2024-04-16 ENCOUNTER — Ambulatory Visit

## 2024-04-24 ENCOUNTER — Ambulatory Visit

## 2024-04-29 ENCOUNTER — Ambulatory Visit

## 2024-05-09 ENCOUNTER — Ambulatory Visit

## 2024-09-26 ENCOUNTER — Ambulatory Visit: Payer: Self-pay | Admitting: Neurology
